# Patient Record
Sex: Female | Born: 1990 | Race: White | Hispanic: No | Smoking: Never smoker
Health system: Southern US, Community
[De-identification: ages and names within clinical notes are randomized; demographics above are authoritative.]

---

## 2018-03-30 ENCOUNTER — Encounter: Payer: Self-pay | Admitting: Emergency Medicine

## 2018-03-30 DIAGNOSIS — F329 Major depressive disorder, single episode, unspecified: Secondary | ICD-10-CM | POA: Insufficient documentation

## 2018-03-30 DIAGNOSIS — F988 Other specified behavioral and emotional disorders with onset usually occurring in childhood and adolescence: Secondary | ICD-10-CM | POA: Insufficient documentation

## 2018-03-30 DIAGNOSIS — F419 Anxiety disorder, unspecified: Secondary | ICD-10-CM | POA: Insufficient documentation

## 2018-04-12 ENCOUNTER — Other Ambulatory Visit: Payer: Self-pay | Admitting: Physician Assistant

## 2018-04-12 MED ORDER — AMPHETAMINE-DEXTROAMPHETAMINE 30 MG PO TABS: 30.0000 mg | ORAL_TABLET | Freq: Two times a day (BID) | ORAL | 0 refills | Status: DC

## 2018-04-18 ENCOUNTER — Encounter: Payer: Self-pay | Admitting: Physician Assistant

## 2018-04-18 ENCOUNTER — Ambulatory Visit (INDEPENDENT_AMBULATORY_CARE_PROVIDER_SITE_OTHER): Payer: 59 | Admitting: Physician Assistant

## 2018-04-18 VITALS — BP 113/70 | HR 102

## 2018-04-18 DIAGNOSIS — F331 Major depressive disorder, recurrent, moderate: Secondary | ICD-10-CM

## 2018-04-18 DIAGNOSIS — F900 Attention-deficit hyperactivity disorder, predominantly inattentive type: Secondary | ICD-10-CM | POA: Diagnosis not present

## 2018-04-18 DIAGNOSIS — Z630 Problems in relationship with spouse or partner: Secondary | ICD-10-CM

## 2018-04-18 MED ORDER — VORTIOXETINE HBR 10 MG PO TABS: 10.0000 mg | ORAL_TABLET | Freq: Every day | ORAL | 1 refills | Status: DC

## 2018-04-18 NOTE — Progress Notes (Addendum)
[facility] Check  Patient ID: Olivia White,  MRN: [id number]  PCP: Patient, No Pcp Per  Date of Evaluation: 04/18/2018 Time spent:15 minutes  Chief Complaint:  Chief Complaint    Medication Refill      HISTORY/CURRENT STATUS: HPI Overdue for routine med check.  Went off the Cymbalta mid Nov.  Forgot them on a business trip, so was off for 3 1/2 weeks.  When she got home, wasn't sure if she should restart them or whatever.  She had no withdrawals except nausea, and felt no worse or better off them.  States she is not really sure if the medicine was helping or not.  Her life circumstances are not ideal right now.  She and husband are separated.  She has had to move back in with her parents.  She has there son at the moment, while her husband has moved back to [place] to be with his family.  Patient states her anxiety is through the roof and she does not know what she is going to do.  She asked about going back on the Emsam which is 1 of the few things that seem to help her in the past.  The other meds she is tried, particularly the SSRIs, were when she was hospitalized in the past and she is unsure if they worked or not or whether she took them long enough to be effective.  Does complain of anhedonia and decreased motivation and energy but the Adderall does help that part.  She is working and not missing days.  She sleeps pretty well.  If she could, she would stay in bed a lot.  She denies suicidal or homicidal thoughts.  Sometimes she does feel like she wishes she was not here, "I would never do anything to hurt myself because I need to be here for my son.  He is too important to me to do something selfish like that."  Her focus and concentration are good at the moment.  Reports generalized feeling of anxiety.  "I know it is just what is going on in my life but staying anxious."  Rare panic attacks.  Individual Medical History/ Review of Systems: Changes? :No    Past medications for  mental health diagnoses include: Latuda, Emsam, Vraylar, Adderall, melatonin, Risperdal which she hated for some reason remember, Klonopin, Xanax, Prozac, Zoloft, Lexapro, Celexa, Abilify, Zyprexa, Seroquel, lithium, Lamictal, Tegretol, Effexor, Wellbutrin, Cymbalta,  Allergies: Patient has no known allergies.  Current Medications:  Current Outpatient Medications:  .  amphetamine-dextroamphetamine (ADDERALL) 30 MG tablet, Take 30 mg by mouth 2 (two) times daily., Disp: , Rfl:  .  amphetamine-dextroamphetamine (ADDERALL) 30 MG tablet, Take 1 tablet by mouth 2 (two) times daily., Disp: 60 tablet, Rfl: 0 .  vortioxetine HBr (TRINTELLIX) 10 MG TABS tablet, Take 1 tablet (10 mg total) by mouth daily., Disp: 30 tablet, Rfl: 1 Medication Side Effects: none  Family Medical/ Social History: Changes? Yes She and husband are still having lots of problems. She's living with her parents and her son   [place]:  Blood pressure 113/70, pulse (!) 102.There is no height or weight on file to calculate BMI.  General Appearance: Casual and Well Groomed  Eye Contact:  Good  Speech:  Clear and Coherent  Volume:  Normal  Mood:  Euthymic  Affect:  Appropriate  Thought Process:  Goal Directed  Orientation:  Full (Time, Place, and Person)  Thought Content: Logical   Suicidal Thoughts:  No  Homicidal  Thoughts:  No  Memory:  WNL  Judgement:  Good  Insight:  Good  Psychomotor Activity:  Normal  Concentration:  Concentration: Good and Attention Span: Good  Recall:  Good  Fund of Knowledge: Good  Language: Good  Assets:  Desire for Improvement  ADL's:  Intact  Cognition: WNL  Prognosis:  Good    DIAGNOSES:    ICD-10-CM   1. Attention deficit hyperactivity disorder (ADHD), predominantly inattentive type F90.0   2. Major depressive disorder, recurrent episode, moderate (HCC) F33.1   3. Marital stress Z63.0     Receiving Psychotherapy: No    RECOMMENDATIONS: We discussed the Emsam.  She  really should not take that with the Adderall.  I can discuss with Dr. Clovis Pu however if that is her only choice. Recommend either Trintellix or Viibryd.  We decided on Trintellix and I gave her a sample of 5 mg #7 1 p.o. daily and then Trintellix 10 mg #7 1 daily.  Then prescription for 10 mg for 30 days.  Benefits, risks, side effects were discussed and she accepts. Continue Adderall as above. Return in 4 weeks or sooner as needed.  Addendum: Her insurance be changing within the next few months.  In fact, her husband might drop her altogether and she may not be able to get insurance with her job for 2 months.  I told her that if that does happen I will make sure she has her prescriptions for that time and then will see her back once her insurance becomes effective.  Donnal Moat, PA-C

## 2018-04-19 ENCOUNTER — Encounter (HOSPITAL_COMMUNITY): Payer: Self-pay

## 2018-04-19 ENCOUNTER — Other Ambulatory Visit: Payer: Self-pay

## 2018-04-19 ENCOUNTER — Emergency Department (HOSPITAL_COMMUNITY)
Admission: EM | Admit: 2018-04-19 | Discharge: 2018-04-19 | Disposition: A | Discharge status: Home / Self Care | Payer: 59 | Attending: Emergency Medicine | Admitting: Emergency Medicine

## 2018-04-19 ENCOUNTER — Emergency Department (HOSPITAL_COMMUNITY): Payer: 59

## 2018-04-19 DIAGNOSIS — N200 Calculus of kidney: Secondary | ICD-10-CM

## 2018-04-19 DIAGNOSIS — K769 Liver disease, unspecified: Secondary | ICD-10-CM

## 2018-04-19 DIAGNOSIS — Z79899 Other long term (current) drug therapy: Secondary | ICD-10-CM | POA: Diagnosis not present

## 2018-04-19 DIAGNOSIS — R3129 Other microscopic hematuria: Secondary | ICD-10-CM | POA: Insufficient documentation

## 2018-04-19 DIAGNOSIS — R1030 Lower abdominal pain, unspecified: Secondary | ICD-10-CM

## 2018-04-19 DIAGNOSIS — R112 Nausea with vomiting, unspecified: Secondary | ICD-10-CM | POA: Insufficient documentation

## 2018-04-19 DIAGNOSIS — D376 Neoplasm of uncertain behavior of liver, gallbladder and bile ducts: Secondary | ICD-10-CM | POA: Diagnosis not present

## 2018-04-19 DIAGNOSIS — R197 Diarrhea, unspecified: Secondary | ICD-10-CM | POA: Diagnosis not present

## 2018-04-19 LAB — COMPREHENSIVE METABOLIC PANEL
ALT: 12 U/L (ref 0–44)
AST: 18 U/L (ref 15–41)
Albumin: 4.1 g/dL (ref 3.5–5.0)
Alkaline Phosphatase: 43 U/L (ref 38–126)
Anion gap: 9 (ref 5–15)
BUN: 11 mg/dL (ref 6–20)
CO2: 23 mmol/L (ref 22–32)
Calcium: 9.5 mg/dL (ref 8.9–10.3)
Chloride: 107 mmol/L (ref 98–111)
Creatinine, Ser: 1.16 mg/dL — ABNORMAL HIGH (ref 0.44–1.00)
GFR calc Af Amer: >60 mL/min (ref >60)
GFR calc non Af Amer: >60 mL/min (ref >60)
Glucose, Bld: 88 mg/dL (ref 70–99)
Potassium: 3.5 mmol/L (ref 3.5–5.1)
Sodium: 139 mmol/L (ref 135–145)
Total Bilirubin: 1 mg/dL (ref 0.3–1.2)
Total Protein: 7 g/dL (ref 6.5–8.1)

## 2018-04-19 LAB — CBC WITH DIFFERENTIAL/PLATELET
Abs Immature Granulocytes: 0.01 10*3/uL (ref 0.00–0.07)
Basophils Absolute: 0 10*3/uL (ref 0.0–0.1)
Basophils Relative: 0 %
Eosinophils Absolute: 0 10*3/uL (ref 0.0–0.5)
Eosinophils Relative: 0 %
HCT: 43.5 % (ref 36.0–46.0)
Hemoglobin: 14.8 g/dL (ref 12.0–15.0)
Immature Granulocytes: 0 %
Lymphocytes Relative: 11 %
Lymphs Abs: 0.7 10*3/uL (ref 0.7–4.0)
MCH: 31 pg (ref 26.0–34.0)
MCHC: 34 g/dL (ref 30.0–36.0)
MCV: 91.2 fL (ref 80.0–100.0)
Monocytes Absolute: 0.2 10*3/uL (ref 0.1–1.0)
Monocytes Relative: 3 %
Neutro Abs: 5.6 10*3/uL (ref 1.7–7.7)
Neutrophils Relative %: 86 %
Platelets: 343 10*3/uL (ref 150–400)
RBC: 4.77 MIL/uL (ref 3.87–5.11)
RDW: 13 % (ref 11.5–15.5)
WBC: 6.5 10*3/uL (ref 4.0–10.5)
nRBC: 0 % (ref 0.0–0.2)

## 2018-04-19 LAB — URINALYSIS, ROUTINE W REFLEX MICROSCOPIC
Bilirubin Urine: NEGATIVE
Glucose, UA: NEGATIVE mg/dL
Ketones, ur: 20 mg/dL — AB
Leukocytes, UA: NEGATIVE
Nitrite: NEGATIVE
Protein, ur: 100 mg/dL — AB
RBC / HPF: >50 RBC/hpf — ABNORMAL HIGH (ref 0–5)
Specific Gravity, Urine: 1.028 (ref 1.005–1.030)
pH: 6 (ref 5.0–8.0)

## 2018-04-19 LAB — WET PREP, GENITAL
Clue Cells Wet Prep HPF POC: NONE SEEN
Sperm: NONE SEEN
Trich, Wet Prep: NONE SEEN
Yeast Wet Prep HPF POC: NONE SEEN

## 2018-04-19 LAB — I-STAT BETA HCG BLOOD, ED (MC, WL, AP ONLY): I-stat hCG, quantitative: <5 m[IU]/mL (ref <5)

## 2018-04-19 LAB — LIPASE, BLOOD: Lipase: 25 U/L (ref 11–51)

## 2018-04-19 MED ORDER — MORPHINE SULFATE (PF) 4 MG/ML IV SOLN
4.0000 mg | Freq: Once | INTRAVENOUS | Status: AC
  Administered 2018-04-19: 4 mg via INTRAVENOUS
  Filled 2018-04-19: qty 1

## 2018-04-19 MED ORDER — ONDANSETRON HCL 4 MG/2ML IJ SOLN
4.0000 mg | Freq: Once | INTRAMUSCULAR | Status: AC
  Administered 2018-04-19: 4 mg via INTRAVENOUS
  Filled 2018-04-19: qty 2

## 2018-04-19 MED ORDER — IOHEXOL 300 MG/ML  SOLN
100.0000 mL | Freq: Once | INTRAMUSCULAR | Status: AC | PRN
  Administered 2018-04-19: 100 mL via INTRAVENOUS

## 2018-04-19 MED ORDER — ONDANSETRON 4 MG PO TBDP: 4.0000 mg | ORAL_TABLET | Freq: Three times a day (TID) | ORAL | 0 refills | Status: DC | PRN

## 2018-04-19 MED ORDER — KETOROLAC TROMETHAMINE 30 MG/ML IJ SOLN
30.0000 mg | Freq: Once | INTRAMUSCULAR | Status: AC
  Administered 2018-04-19: 30 mg via INTRAVENOUS
  Filled 2018-04-19: qty 1

## 2018-04-19 MED ORDER — HYDROCODONE-ACETAMINOPHEN 5-325 MG PO TABS: 1.0000 | ORAL_TABLET | Freq: Four times a day (QID) | ORAL | 0 refills | Status: DC | PRN

## 2018-04-19 MED ORDER — SODIUM CHLORIDE 0.9 % IV BOLUS
1000.0000 mL | Freq: Once | INTRAVENOUS | Status: AC
  Administered 2018-04-19: 1000 mL via INTRAVENOUS

## 2018-04-19 MED ORDER — ONDANSETRON 4 MG PO TBDP
4.0000 mg | ORAL_TABLET | Freq: Once | ORAL | Status: AC | PRN
  Administered 2018-04-19: 4 mg via ORAL
  Filled 2018-04-19: qty 1

## 2018-04-19 NOTE — ED Notes (Signed)
Discharge instructions and prescriptions discussed with Pt. Pt verbalized understanding. Pt stable and ambulatory.   

## 2018-04-19 NOTE — ED Provider Notes (Signed)
[facility] EMERGENCY DEPARTMENT Provider Note   CSN: [id number] Arrival date & time: 04/19/18  1522     History   Chief Complaint Chief Complaint  Patient presents with  . Abdominal Pain    HPI Olivia White is a 28 y.o. female with history of anxiety and major depression and ADD presenting for evaluation of acute onset, progressively worsening lower abdominal pain for 4 days.  She reports severe pain beginning today with multiple episodes of red emesis.  Also notes loose stools with some bright red blood in the commode though she reports this is not unusual for her.  She ended her menses 4 days ago but has noted a red vaginal discharge.  Denies urinary symptoms, chest pain, shortness of breath.  Endorse subjective fevers and chills earlier today.  Has not tried anything for her symptoms.  No aggravating or relieving factors noted.  Sent from urgent care for further evaluation.  She reports that she was last sexually active 5 months ago but did use protection.  The history is provided by the patient.    History reviewed. No pertinent past medical history.  Patient Active Problem List   Diagnosis Date Noted  . MDD (major depressive disorder) 03/30/2018  . ADD (attention deficit disorder) 03/30/2018  . Anxiety 03/30/2018    History reviewed. No pertinent surgical history.   OB History   No obstetric history on file.      Home Medications    Prior to Admission medications   Medication Sig Start Date End Date Taking? Authorizing Provider  amphetamine-dextroamphetamine (ADDERALL) 30 MG tablet Take 1 tablet by mouth 2 (two) times daily. 04/12/18  Yes Hurst, Dorothea Glassman, PA-C  vortioxetine HBr (TRINTELLIX) 10 MG TABS tablet Take 1 tablet (10 mg total) by mouth daily. 04/18/18  Yes Hurst, Dorothea Glassman, PA-C  HYDROcodone-acetaminophen (NORCO/VICODIN) 5-325 MG tablet Take 1 tablet by mouth every 6 (six) hours as needed for severe pain. 04/19/18   Merlin Ege A, PA-C    ondansetron (ZOFRAN ODT) 4 MG disintegrating tablet Take 1 tablet (4 mg total) by mouth every 8 (eight) hours as needed for nausea or vomiting. 04/19/18   Renita Papa, PA-C    Family History History reviewed. No pertinent family history.  Social History Social History   Tobacco Use  . Smoking status: Never Smoker  . Smokeless tobacco: Never Used  Substance Use Topics  . Alcohol use: Not Currently  . Drug use: Not Currently     Allergies   Patient has no known allergies.   Review of Systems Review of Systems  Constitutional: Positive for chills and fever.  Gastrointestinal: Positive for abdominal pain, diarrhea, nausea and vomiting.  Genitourinary: Positive for vaginal discharge. Negative for dysuria, frequency, hematuria and urgency.  All other systems reviewed and are negative.    Physical Exam Updated Vital Signs BP 105/63   Pulse 98   Temp 97.7 F (36.5 C) (Oral)   Resp 18   Ht 5\' 6"  (1.676 m)   Wt 58.1 kg   LMP 04/11/2018   SpO2 100%   BMI 20.66 kg/m   Physical Exam Vitals signs and nursing note reviewed. Exam conducted with a chaperone present.  Constitutional:      General: She is not in acute distress.    Appearance: She is well-developed.     Comments: Appears uncomfortable  HENT:     Head: Normocephalic and atraumatic.  Eyes:     General:  Right eye: No discharge.        Left eye: No discharge.     Conjunctiva/sclera: Conjunctivae normal.  Neck:     Vascular: No JVD.     Trachea: No tracheal deviation.  Cardiovascular:     Rate and Rhythm: Normal rate and regular rhythm.  Pulmonary:     Effort: Pulmonary effort is normal.     Breath sounds: Normal breath sounds.  Abdominal:     General: Abdomen is flat. Bowel sounds are decreased. There is no distension.     Palpations: Abdomen is soft.     Tenderness: There is abdominal tenderness in the right lower quadrant and suprapubic area. There is guarding and rebound. Positive signs  include Rovsing's sign, McBurney's sign, psoas sign and obturator sign. Negative signs include Murphy's sign.  Genitourinary:    Labia:        Right: No rash, tenderness or lesion.        Left: No rash, tenderness or lesion.      Cervix: No cervical motion tenderness or friability.     Adnexa:        Right: No tenderness or fullness.         Left: No tenderness or fullness.       Comments: Examination performed in the presence of a chaperone.  Moderate amount of brownish-red discharge in the vaginal vault Skin:    General: Skin is warm and dry.     Findings: No erythema.  Neurological:     Mental Status: She is alert.  Psychiatric:        Behavior: Behavior normal.      ED Treatments / Results  Labs (all labs ordered are listed, but only abnormal results are displayed) Labs Reviewed  WET PREP, GENITAL - Abnormal; Notable for the following components:      Result Value   WBC, Wet Prep HPF POC FEW (*)    All other components within normal limits  COMPREHENSIVE METABOLIC PANEL - Abnormal; Notable for the following components:   Creatinine, Ser 1.16 (*)    All other components within normal limits  URINALYSIS, ROUTINE W REFLEX MICROSCOPIC - Abnormal; Notable for the following components:   APPearance HAZY (*)    Hgb urine dipstick MODERATE (*)    Ketones, ur 20 (*)    Protein, ur 100 (*)    RBC / HPF >50 (*)    Bacteria, UA MANY (*)    All other components within normal limits  URINE CULTURE  CBC WITH DIFFERENTIAL/PLATELET  LIPASE, BLOOD  RPR  HIV ANTIBODY (ROUTINE TESTING W REFLEX)  I-STAT BETA HCG BLOOD, ED ([facility], [facility], [facility] ONLY)  GC/CHLAMYDIA PROBE AMP ([place]) NOT AT [facility]    EKG None  Radiology Ct Abdomen Pelvis W Contrast  Result Date: 04/19/2018 CLINICAL DATA:  Intermittent bilateral lower abdominal pain for 4-5 days, severe and constant today. EXAM: CT ABDOMEN AND PELVIS WITH CONTRAST TECHNIQUE: Multidetector CT imaging of the abdomen and pelvis was performed  using the standard protocol following bolus administration of intravenous contrast. CONTRAST:  158mL OMNIPAQUE IOHEXOL 300 MG/ML  SOLN COMPARISON:  None. FINDINGS: Lower chest: Clear lung bases. Hepatobiliary: 1.6 x 1.4 cm hyperenhancing lesion in hepatic segment VI. Mildly distended gallbladder without stones, acute inflammation, or biliary dilatation identified. Pancreas: Unremarkable. Spleen: Unremarkable. Adrenals/Urinary Tract: Unremarkable adrenal glands. Multiple small renal calculi bilaterally with the largest measuring 4 mm in the right lower pole. No ureteral calculi or hydroureteronephrosis. Unremarkable bladder. Stomach/Bowel: The  stomach is unremarkable. There is no evidence of bowel obstruction or inflammation. The appendix is unremarkable. Vascular/Lymphatic: Normal caliber of the abdominal aorta. Dilated ovarian veins measuring 10 mm in diameter on the right and 12 mm on the left with dilated parauterine veins measuring up to 8 mm in diameter, slightly greater on the left than the right. No enlarged lymph nodes. Reproductive: Grossly unremarkable uterus and ovaries. Other: No ascites or pneumoperitoneum. Small fat containing umbilical hernia. Musculoskeletal: No acute osseous abnormality or suspicious osseous lesion. IMPRESSION: 1. No acute abnormality identified in the abdomen or pelvis. 2. Bilateral nonobstructing nephrolithiasis. 3. 1.6 cm hyperenhancing liver lesion. Nonemergent abdominal MRI without and with contrast is recommended for further evaluation. 4. Dilated ovarian and parauterine veins which can be seen with pelvic congestion syndrome. Electronically Signed   By: Logan Bores M.D.   On: 04/19/2018 18:19    Procedures Procedures (including critical care time)  Medications Ordered in ED Medications  ondansetron (ZOFRAN-ODT) disintegrating tablet 4 mg (4 mg Oral Given 04/19/18 1606)  morphine 4 MG/ML injection 4 mg (4 mg Intravenous Given 04/19/18 1641)  ketorolac (TORADOL) 30 MG/ML  injection 30 mg (30 mg Intravenous Given 04/19/18 1729)  ondansetron (ZOFRAN) injection 4 mg (4 mg Intravenous Given 04/19/18 1728)  sodium chloride 0.9 % bolus 1,000 mL (0 mLs Intravenous Stopped 04/19/18 2016)  iohexol (OMNIPAQUE) 300 MG/ML solution 100 mL (100 mLs Intravenous Contrast Given 04/19/18 1750)     Initial Impression / Assessment and Plan / ED Course  I have reviewed the triage vital signs and the nursing notes.  Pertinent labs & imaging results that were available during my care of the patient were reviewed by me and considered in my medical decision making (see chart for details).     Patient presenting for evaluation of abdominal pain worsening over the last 4 days.  She is afebrile, initially tachycardic with resolution on reevaluation.  She appears quite uncomfortable.  We will give pain medicine, nausea medicine, IV fluids, obtain lab work and CT scan and reassess.  Lab work reviewed by me shows no leukocytosis, no anemia, no metabolic derangements.  Creatinine is mildly elevated but BUN is within normal limits so this may be chronic.  LFTs, lipase within normal limits.  UA suggests nephrolithiasis versus UTI with >50 RBCs, many bacteria.  However, this could be contaminant from the vaginal bleeding patient was complaining of.  On exam she does have scant amount of brownish-red vaginal discharge.  No evidence for PID in the absence of cervical motion tenderness or adnexal tenderness.  STI cultures obtained including GC chlamydia, HIV, syphilis.  Wet prep shows few WBCs but no evidence of BV, trichomoniasis, or candidiasis.  CT scan of the abdomen and pelvis shows bilateral nonobstructing nephrolithiasis, 1.6 cm hyperenhancing liver lesion, and evidence of possible pelvic congestion syndrome.  I discussed the patient's findings with her and on reassessment she reports she is feeling much better.  She is tolerating p.o. fluids without difficulty.  Serial abdominal examinations remain  benign.  No evidence of acute surgical abdominal pathology including obstruction, perforation, appendicitis, cholecystitis, or dissection.  I suspect she may have a nonobstructing punctate stone that was not picked up on by CT scan with contrast which could explain her pain and hematuria.  Alternatively, her pelvic congestion syndrome could be causing her symptoms.  Will discharge with small amount of hydrocodone for severe breakthrough pain, [place] controlled substance registry was queried and no inconsistencies were found.  We will  also discharge with Zofran.  Recommend follow-up with PCP or urology for reevaluation of symptoms.  Discussed strict ED return precautions. Pt verbalized understanding of and agreement with plan and is safe for discharge home at this time.  Discussed with Dr. Laverta Baltimore who agrees with assessment and plan at this time.  Final Clinical Impressions(s) / ED Diagnoses   Final diagnoses:  Lower abdominal pain  Nausea vomiting and diarrhea  Other microscopic hematuria  Nephrolithiasis  Liver lesion    ED Discharge Orders         Ordered    HYDROcodone-acetaminophen (NORCO/VICODIN) 5-325 MG tablet  Every 6 hours PRN     04/19/18 1954    ondansetron (ZOFRAN ODT) 4 MG disintegrating tablet  Every 8 hours PRN     04/19/18 1954           Renita Papa, PA-C 04/19/18 2311    Margette Fast, MD 04/20/18 [phone]

## 2018-04-19 NOTE — ED Triage Notes (Signed)
Pt from Urgent Care w/ a c/o bilateral lower abd pain that has been intermittent for the past 4-5 days, but today has been severe and constant. She describes the pain as labor like pain. Nausea for a couple of days, and the vomiting began today. She has vomited many times and states that it was red. She has noted a red vaginal discharge and ended her menses 4 days ago. Urine is reported to be "dark yellow to green." No odors noted.   101/59 HR 101 T 97.7 99% RA

## 2018-04-19 NOTE — Discharge Instructions (Signed)
1. Medications: Alternate 600 mg of ibuprofen and [phone] mg of Tylenol every 3 hours as needed for pain. Do not exceed 4000 mg of Tylenol daily.  Take ibuprofen with food to avoid upset stomach.  You can take hydrocodone as needed for severe pain but do not drive, drink alcohol, or operate heavy machinery while taking this medicine as it may make you drowsy.  Be aware this medicine also contains Tylenol.  You can also cut these tablets in half if they are especially strong.  Take Zofran as needed for nausea.  Wait around 20 minutes before eating or drinking after taking this medication. 2. Treatment: rest, drink plenty of fluids, advance diet slowly.  Start with water and broth then advance to bland foods that will not upset your stomach such as crackers, mashed potatoes, and peanut butter. 3. Follow Up: Please followup with your primary doctor in 3 days for discussion of your diagnoses and further evaluation after today's visit; your CT scan showed some abnormal findings including kidney stones, a liver lesion, and the possibility of pelvic congestion syndrome which could potentially be causing your pain.  If you do not have a primary care doctor use the resource guide provided to find one; Please return to the ER for persistent vomiting, high fevers or worsening symptoms

## 2018-04-19 NOTE — ED Notes (Signed)
Pt became nauseous and vomited post morphine admin.

## 2018-04-20 LAB — GC/CHLAMYDIA PROBE AMP (~~LOC~~) NOT AT ARMC
Chlamydia: NEGATIVE
Neisseria Gonorrhea: NEGATIVE

## 2018-04-20 LAB — HIV ANTIBODY (ROUTINE TESTING W REFLEX): HIV Screen 4th Generation wRfx: NONREACTIVE

## 2018-04-20 LAB — URINE CULTURE: Culture: NO GROWTH

## 2018-04-20 LAB — RPR: RPR Ser Ql: NONREACTIVE

## 2018-04-28 ENCOUNTER — Telehealth: Payer: Self-pay

## 2018-04-28 NOTE — Telephone Encounter (Signed)
Prior approval received from [facility] effective 04/28/2018-04/28/2020  PA # [id number]

## 2018-05-11 ENCOUNTER — Telehealth: Payer: Self-pay | Admitting: Physician Assistant

## 2018-05-11 ENCOUNTER — Other Ambulatory Visit: Payer: Self-pay | Admitting: Physician Assistant

## 2018-05-11 MED ORDER — AMPHETAMINE-DEXTROAMPHETAMINE 30 MG PO TABS: 30.0000 mg | ORAL_TABLET | Freq: Two times a day (BID) | ORAL | 0 refills | Status: DC

## 2018-05-11 NOTE — Telephone Encounter (Signed)
Patient requesting refill of Adderall until her appointment on Monday. Please fill at the [facility] on [address].Thanks

## 2018-05-11 NOTE — Telephone Encounter (Signed)
Patient called to check on refill again.  Said she did p/u 12/31 so today is 30 days.  She is out and concerned about getting it filled.

## 2018-05-11 NOTE — Telephone Encounter (Signed)
Last rx 12/31  See msg

## 2018-05-16 ENCOUNTER — Ambulatory Visit: Payer: 59 | Admitting: Physician Assistant

## 2018-05-16 ENCOUNTER — Telehealth: Payer: Self-pay | Admitting: Physician Assistant

## 2018-05-16 NOTE — Telephone Encounter (Signed)
Per conversation with Helene Kelp. Patient would like to discuss waving the fee for not showing due to child being sick. Please call.

## 2018-05-16 NOTE — Telephone Encounter (Signed)
Please review message

## 2018-05-16 NOTE — Telephone Encounter (Signed)
Since her child is sick, no late fee.

## 2018-06-20 ENCOUNTER — Ambulatory Visit: Payer: 59 | Admitting: Physician Assistant

## 2018-07-06 ENCOUNTER — Other Ambulatory Visit: Payer: Self-pay | Admitting: Physician Assistant

## 2018-07-06 ENCOUNTER — Telehealth: Payer: Self-pay | Admitting: Physician Assistant

## 2018-07-06 NOTE — Telephone Encounter (Signed)
Needs refill adderall.  Been out for 2 days.  Has appt. 07/08/18 but also has work and would like to have her medication.  Will you please go ahead and send in a refill.  [place]

## 2018-07-08 ENCOUNTER — Ambulatory Visit (INDEPENDENT_AMBULATORY_CARE_PROVIDER_SITE_OTHER): Payer: 59 | Admitting: Physician Assistant

## 2018-07-08 ENCOUNTER — Encounter: Payer: Self-pay | Admitting: Physician Assistant

## 2018-07-08 DIAGNOSIS — F900 Attention-deficit hyperactivity disorder, predominantly inattentive type: Secondary | ICD-10-CM

## 2018-07-08 DIAGNOSIS — F331 Major depressive disorder, recurrent, moderate: Secondary | ICD-10-CM | POA: Diagnosis not present

## 2018-07-08 MED ORDER — DESVENLAFAXINE SUCCINATE ER 50 MG PO TB24: 50.0000 mg | ORAL_TABLET | Freq: Every day | ORAL | 1 refills | Status: DC

## 2018-07-08 NOTE — Progress Notes (Signed)
[facility]  Patient ID: Olivia White,  MRN: [id number]  PCP: Olivia White  Date of Evaluation: 07/08/2018 Time spent:15 minutes  Chief Complaint:  Chief Complaint    Follow-up; Depression     Virtual Visit via Telephone Note  I connected with Olivia White on 07/08/18 at  1:30 PM EDT by telephone and verified that I am speaking with the correct person using two identifiers.   I discussed the limitations, risks, security and privacy concerns of performing an evaluation and management service by telephone and the availability of in person appointments. I also discussed with the patient that there may be a patient responsible charge related to this service. The patient expressed understanding and agreed to proceed.   HISTORY/CURRENT STATUS: HPI For med f/u.   Doesn't see any improvement with the Trintellix. Also caused nausea really bad.  Has weaned off. She wants to go off Adderall if at all possible too.  She stopped it for a few weeks ago but became really depressed and had passive suicidal thoughts.  "I just felt like it would be nice if I did not have to live like this.  But I am not going to do anything.  I would never hurt myself because I do not want to hurt my son."  Since having restarted the Adderall however, she feels less depressed, more energetic and is more motivated.  She is no longer having suicidal thoughts.  She does not cry easily and she is able to enjoy things more.  She is not missing any work.  She would like to try something else for depression.  She seems to remember that the Emsam helped her in the past.    Patient denies increased energy with decreased need for sleep, no increased talkativeness, no racing thoughts, no impulsivity or risky behaviors, no increased spending, no increased libido, no grandiosity.  Denies muscle or joint pain, stiffness, or dystonia.  Denies dizziness, syncope, seizures, numbness, tingling, tremor, tics,  unsteady gait, slurred speech, confusion.   Individual Medical History/ Review of Systems: Changes? :Yes  Had a probable kidney stone since LOV.  Past medications for mental health diagnoses include: Latuda, Emsam, Vraylar, Adderall, melatonin, Risperdal which she hated for some reason remember, Klonopin, Xanax, Prozac, Zoloft, Lexapro, Celexa, Abilify, Zyprexa, Seroquel, lithium, Lamictal, Tegretol, Effexor, Wellbutrin, Cymbalta,  Allergies: Patient has no known allergies.  Current Medications:  Current Outpatient Medications:  .  amphetamine-dextroamphetamine (ADDERALL) 30 MG tablet, Take 1 tablet by mouth 2 (two) times daily., Disp: 60 tablet, Rfl: 0 .  amphetamine-dextroamphetamine (ADDERALL) 30 MG tablet, Take 1 tablet by mouth 2 (two) times daily., Disp: 60 tablet, Rfl: 0 .  amphetamine-dextroamphetamine (ADDERALL) 30 MG tablet, Take 1 tablet by mouth 2 (two) times daily., Disp: 60 tablet, Rfl: 0 .  desvenlafaxine (PRISTIQ) 50 MG 24 hr tablet, Take 1 tablet (50 mg total) by mouth daily., Disp: 30 tablet, Rfl: 1 .  HYDROcodone-acetaminophen (NORCO/VICODIN) 5-325 MG tablet, Take 1 tablet by mouth every 6 (six) hours as needed for severe pain. (Patient not taking: Reported on 07/08/2018), Disp: 6 tablet, Rfl: 0 .  ondansetron (ZOFRAN ODT) 4 MG disintegrating tablet, Take 1 tablet (4 mg total) by mouth every 8 (eight) hours as needed for nausea or vomiting. (Patient not taking: Reported on 07/08/2018), Disp: 10 tablet, Rfl: 0 Medication Side Effects: nausea with Trintellix  Family Medical/ Social History: Changes? She and her husband are separated and divorcing.   MENTAL HEALTH EXAM:  There were  no vitals taken for this visit.There is no height or weight on file to calculate BMI.  General Appearance: phone visit, unable to assess  Eye Contact:  unable to assess  Speech:  Clear and Coherent  Volume:  Normal  Mood:  Euthymic  Affect:  unable to assess  Thought Process:  Goal Directed   Orientation:  Full (Time, Place, and Person)  Thought Content: Logical   Suicidal Thoughts:  No  Homicidal Thoughts:  No  Memory:  WNL  Judgement:  Good  Insight:  Good  Psychomotor Activity:  Normal  Concentration:  Concentration: Good  Recall:  Good  Fund of Knowledge: Good  Language: Good  Assets:  Desire for Improvement  ADL's:  Intact  Cognition: WNL  Prognosis:  Good    DIAGNOSES:    ICD-10-CM   1. Major depressive disorder, recurrent episode, moderate (HCC) F33.1   2. Attention deficit hyperactivity disorder (ADHD), predominantly inattentive type F90.0     Receiving Psychotherapy: Yes Olivia White ? name   RECOMMENDATIONS: We discussed different options.  I would be fine with her going back on Emsam, however she is on Adderall and the 2 are usually not prescribed together due to possibility of blood pressure increases.  I especially do not want to prescribe that today because I am not able to get a blood pressure reading on her.  (I am doing a phone visit due to the coronavirus pandemic) Other options include Pristiq, any of the older tricyclic antidepressants or others. Start Pristiq 50 mg p.o. daily. Continue Adderall 30 mg 1/2 twice daily as needed. Continue psychotherapy. Return in 4 to 6 weeks.  Donnal Moat, PA-C   This record has been created using [employer].  Chart creation errors have been sought, but may not always have been located and corrected. Such creation errors do not reflect on the standard of medical care.

## 2018-08-05 ENCOUNTER — Other Ambulatory Visit: Payer: Self-pay | Admitting: Physician Assistant

## 2018-08-05 ENCOUNTER — Telehealth: Payer: Self-pay | Admitting: Physician Assistant

## 2018-08-05 MED ORDER — AMPHETAMINE-DEXTROAMPHETAMINE 30 MG PO TABS: 30.0000 mg | ORAL_TABLET | Freq: Two times a day (BID) | ORAL | 0 refills | Status: DC

## 2018-08-05 NOTE — Telephone Encounter (Signed)
Done

## 2018-08-05 NOTE — Telephone Encounter (Signed)
Olivia White called at 4:00pm to request refill on her Adderall.  She has appt 08/10/18 but will be out this weekend.  Please send in to [facility]

## 2018-08-10 ENCOUNTER — Ambulatory Visit (INDEPENDENT_AMBULATORY_CARE_PROVIDER_SITE_OTHER): Payer: 59 | Admitting: Physician Assistant

## 2018-08-10 ENCOUNTER — Other Ambulatory Visit: Payer: Self-pay

## 2018-08-10 ENCOUNTER — Encounter: Payer: Self-pay | Admitting: Physician Assistant

## 2018-08-10 DIAGNOSIS — F331 Major depressive disorder, recurrent, moderate: Secondary | ICD-10-CM

## 2018-08-10 DIAGNOSIS — F900 Attention-deficit hyperactivity disorder, predominantly inattentive type: Secondary | ICD-10-CM

## 2018-08-10 MED ORDER — DESVENLAFAXINE SUCCINATE ER 100 MG PO TB24: 100.0000 mg | ORAL_TABLET | Freq: Every day | ORAL | 1 refills | Status: DC

## 2018-08-10 NOTE — Progress Notes (Signed)
[facility]  Patient ID: Olivia White,  MRN: [id number]  PCP: Patient, No Pcp Per  Date of Evaluation: 08/10/2018 Time spent:15 minutes  Chief Complaint:  Chief Complaint    Follow-up     Virtual Visit via Telephone Note  I connected with patient by a video enabled telemedicine application or telephone, with their informed consent, and verified patient privacy and that I am speaking with the correct person using two identifiers.  I am private, in my home and the patient is in her car, in private.  I discussed the limitations, risks, security and privacy concerns of performing an evaluation and management service by telephone and the availability of in person appointments. I also discussed with the patient that there may be a patient responsible charge related to this service. The patient expressed understanding and agreed to proceed.   I discussed the assessment and treatment plan with the patient. The patient was provided an opportunity to ask questions and all were answered. The patient agreed with the plan and demonstrated an understanding of the instructions.   The patient was advised to call back or seek an in-person evaluation if the symptoms worsen or if the condition fails to improve as anticipated.  I provided 15 minutes of non-face-to-face time during this encounter.  HISTORY/CURRENT STATUS: HPI For 4-week med check.  At the last visit, we added Pristiq.  States she has not noticed any improvement at all.  She has not noticed any negative effects either though.  She has had some passive thoughts about "not wanting to be here" but is not sure if that is related to the medication or not.  That has been an issue in the past.  She denies having a plan for suicide.  States that she will not do anything to hurt herself because that would hurt her son.  She is working and not missing days.  She sleeps well.  Does not enjoy much of anything and has low energy and  motivation.  The coronavirus pandemic not really affecting her that much.  Not isolating any more than normal.  States she is doing well as far as concentration and attention span.  The Adderall is really helping a lot.  Denies muscle or joint pain, stiffness, or dystonia.  Denies dizziness, syncope, seizures, numbness, tingling, tremor, tics, unsteady gait, slurred speech, confusion.   Individual Medical History/ Review of Systems: Changes? :No    Past medications for mental health diagnoses include: Latuda, Emsam, Vraylar, Adderall, melatonin, Risperdal which she hated for some reason remember, Klonopin, Xanax, Prozac, Zoloft, Lexapro, Celexa, Abilify, Zyprexa, Seroquel, lithium, Lamictal, Tegretol, Effexor, Wellbutrin, Cymbalta,   Allergies: Patient has no known allergies.  Current Medications:  Current Outpatient Medications:  .  amphetamine-dextroamphetamine (ADDERALL) 30 MG tablet, Take 1 tablet by mouth 2 (two) times daily., Disp: 60 tablet, Rfl: 0 .  amphetamine-dextroamphetamine (ADDERALL) 30 MG tablet, Take 1 tablet by mouth 2 (two) times daily., Disp: 60 tablet, Rfl: 0 .  amphetamine-dextroamphetamine (ADDERALL) 30 MG tablet, Take 1 tablet by mouth 2 (two) times daily., Disp: 60 tablet, Rfl: 0 .  desvenlafaxine (PRISTIQ) 100 MG 24 hr tablet, Take 1 tablet (100 mg total) by mouth daily., Disp: 30 tablet, Rfl: 1 .  HYDROcodone-acetaminophen (NORCO/VICODIN) 5-325 MG tablet, Take 1 tablet by mouth every 6 (six) hours as needed for severe pain. (Patient not taking: Reported on 07/08/2018), Disp: 6 tablet, Rfl: 0 .  ondansetron (ZOFRAN ODT) 4 MG disintegrating tablet, Take 1 tablet (4 mg  total) by mouth every 8 (eight) hours as needed for nausea or vomiting. (Patient not taking: Reported on 07/08/2018), Disp: 10 tablet, Rfl: 0 Medication Side Effects: none  Family Medical/ Social History: Changes? No  MENTAL HEALTH EXAM:  There were no vitals taken for this visit.There is no height  or weight on file to calculate BMI.  General Appearance: Unable to assess  Eye Contact:  Unable to assess  Speech:  Clear and Coherent  Volume:  Normal  Mood:  Euthymic  Affect:  Unable to assess  Thought Process:  Goal Directed  Orientation:  Full (Time, Place, and Person)  Thought Content: Logical   Suicidal Thoughts:  Yes.  without intent/plan  Homicidal Thoughts:  No  Memory:  WNL  Judgement:  Good  Insight:  Good  Psychomotor Activity:  Unable to assess  Concentration:  Concentration: Good and Attention Span: Good  Recall:  Good  Fund of Knowledge: Good  Language: Good  Assets:  Desire for Improvement  ADL's:  Intact  Cognition: WNL  Prognosis:  Good    DIAGNOSES:    ICD-10-CM   1. Major depressive disorder, recurrent episode, moderate (HCC) F33.1   2. Attention deficit hyperactivity disorder (ADHD), predominantly inattentive type F90.0     Receiving Psychotherapy: Yes    RECOMMENDATIONS:  Increase Pristiq to 100 mg p.o. daily. We discussed the possibility of going back on Emsam, which she has been on in the past.  Because she is on Adderall however I would want to discuss with Dr. Clovis Pu before prescribing the 2.  Rarely they are prescribed together. Continue Adderall 30 mg twice daily. Contract for safety in place.  She will call if there are problems prior to her next visit. Return in 4 to 6 weeks.   Donnal Moat, PA-C   This record has been created using [employer].  Chart creation errors have been sought, but may not always have been located and corrected. Such creation errors do not reflect on the standard of medical care.

## 2018-08-26 DIAGNOSIS — Z0289 Encounter for other administrative examinations: Secondary | ICD-10-CM

## 2018-09-19 ENCOUNTER — Telehealth: Payer: Self-pay | Admitting: Physician Assistant

## 2018-09-19 NOTE — Telephone Encounter (Signed)
Noted on yellow sticky

## 2018-09-19 NOTE — Telephone Encounter (Signed)
Olivia White called to report that she should not be prescribed Adderall due to addiction issues.  Please note in chart.

## 2019-01-11 ENCOUNTER — Telehealth: Payer: Self-pay | Admitting: Physician Assistant

## 2019-01-11 NOTE — Telephone Encounter (Signed)
Pt called stated she is sending over some ppwrk for you to fill out for work. She is aware of the $45 fee. She stated to fill out exactly like before except change #5 to the current date you complete the forms. She states the frequency and duration of the panic attacks is the same.

## 2019-01-12 NOTE — Telephone Encounter (Signed)
Thanks Traci.

## 2019-05-03 IMAGING — CT CT ABD-PELV W/ CM
2 of 4 series · 15 of 46 positions shown, 17 images · IV contrast (Omni 300)
Comparison: None.

CLINICAL DATA: Intermittent bilateral lower abdominal pain for 4-5
days, severe and constant today.

EXAM:
CT ABDOMEN AND PELVIS WITH CONTRAST
TECHNIQUE: Multidetector CT imaging of the abdomen and pelvis was performed
using the standard protocol following bolus administration of
intravenous contrast.
CONTRAST:  100mL OMNIPAQUE IOHEXOL 300 MG/ML  SOLN

[Series 3: a/p w/ 5mm · axial · 0.69mm/px · z∈[+857,+1262]mm · 12 of 89 slices shown, 14 images]
[im 4/89  soft-tissue]
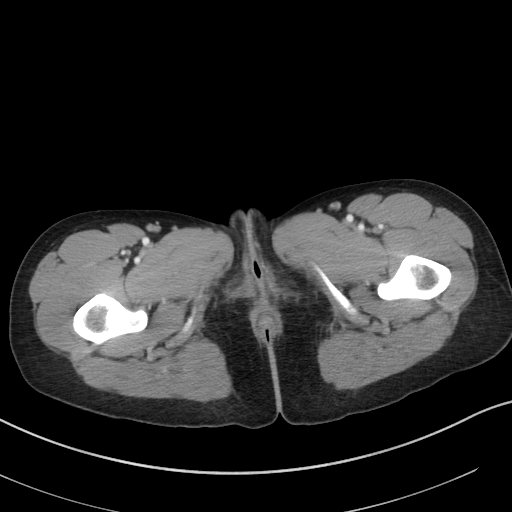
[im 4/89  bone]
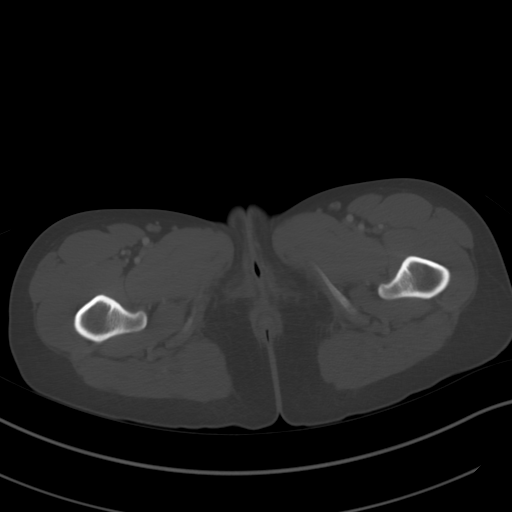
[im 12/89  soft-tissue]
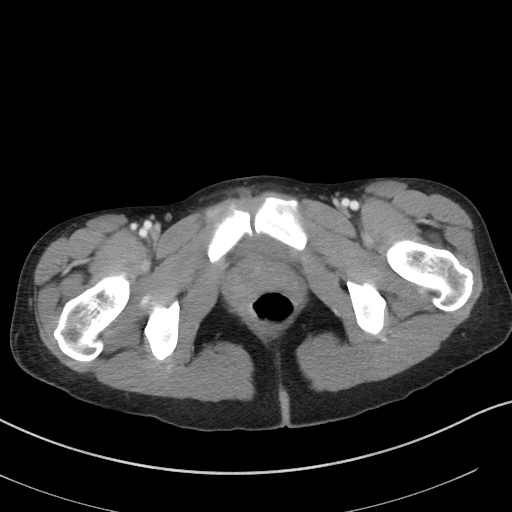
[im 19/89  soft-tissue]
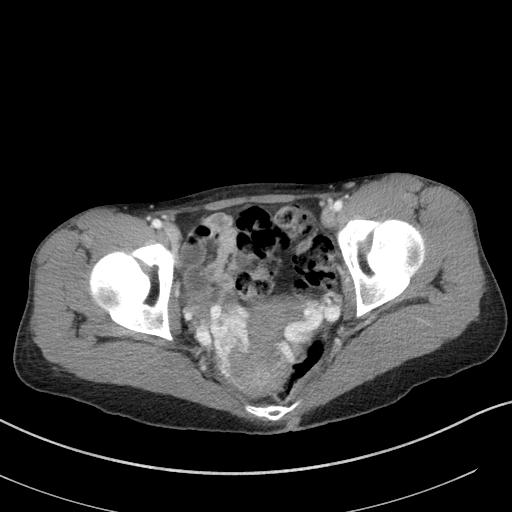
[im 26/89  soft-tissue]
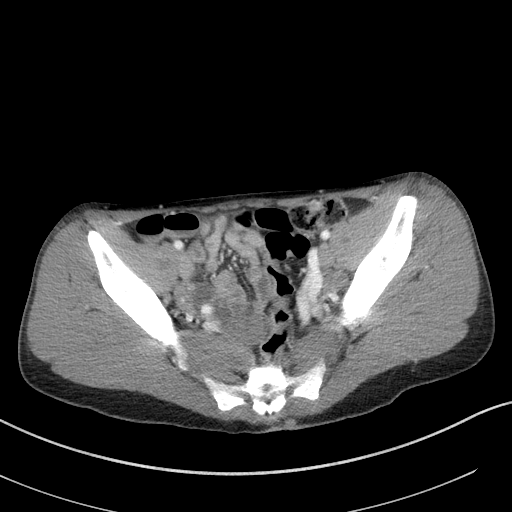
[im 34/89  soft-tissue]
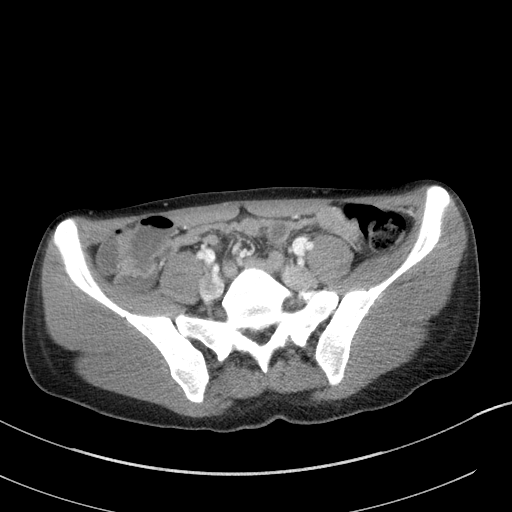
[im 41/89  soft-tissue]
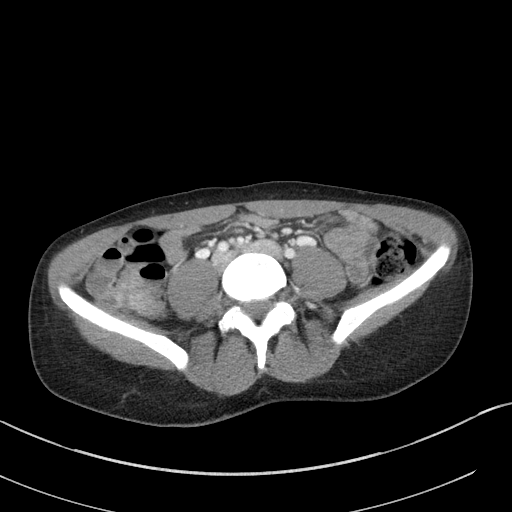
[im 48/89  soft-tissue]
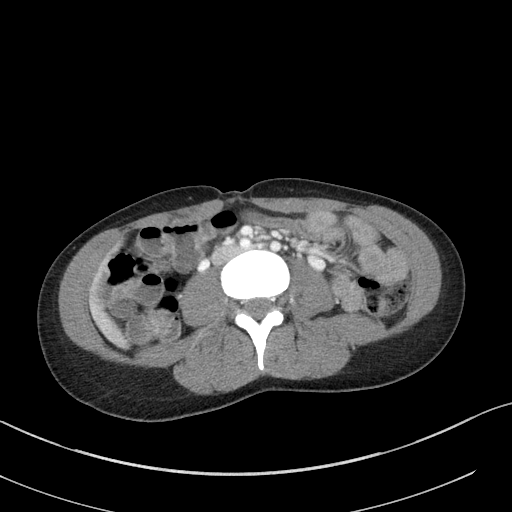
[im 56/89  soft-tissue]
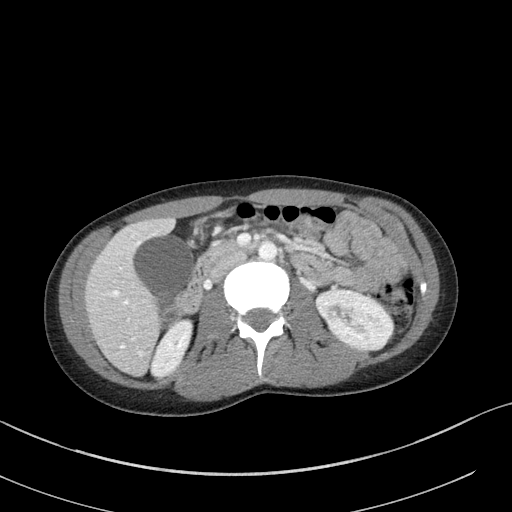
[im 63/89  soft-tissue]
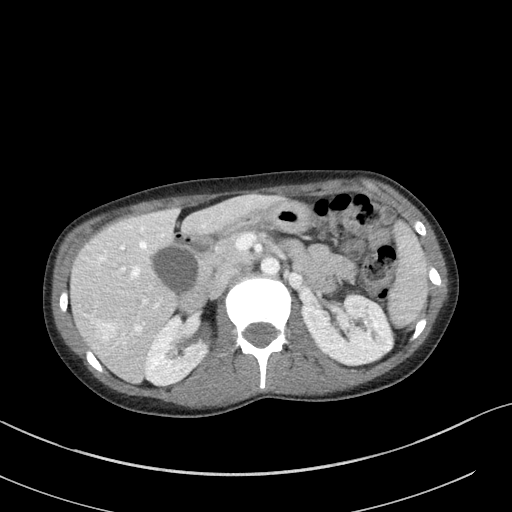
[im 63/89  bone]
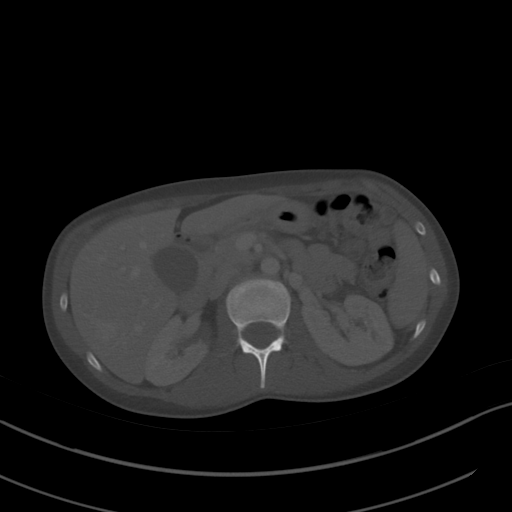
[im 70/89  soft-tissue]
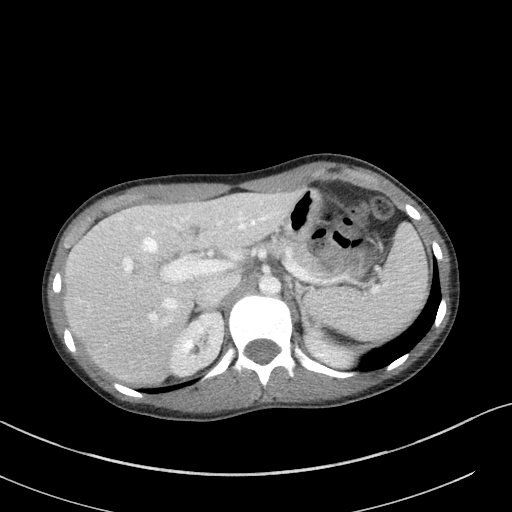
[im 78/89  soft-tissue]
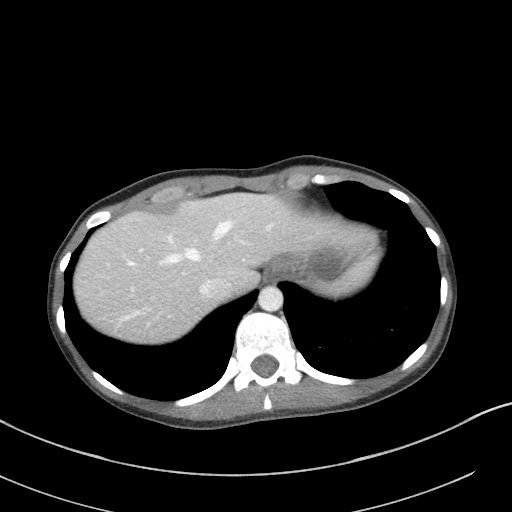
[im 85/89  soft-tissue]
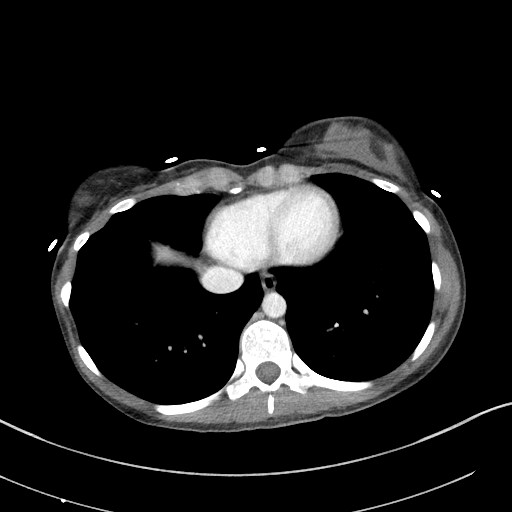

[Series 6: a/p w/ cor · coronal · 0.59mm/px · 3 of 76 slices shown]
[im 26/76  soft-tissue]
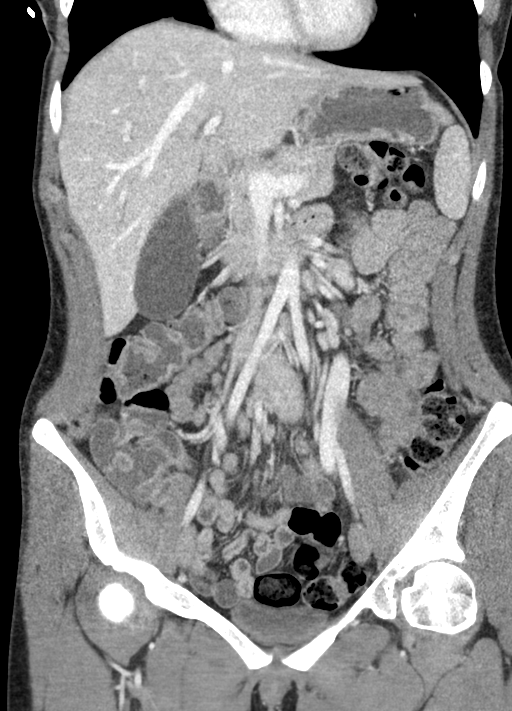
[im 34/76  soft-tissue]
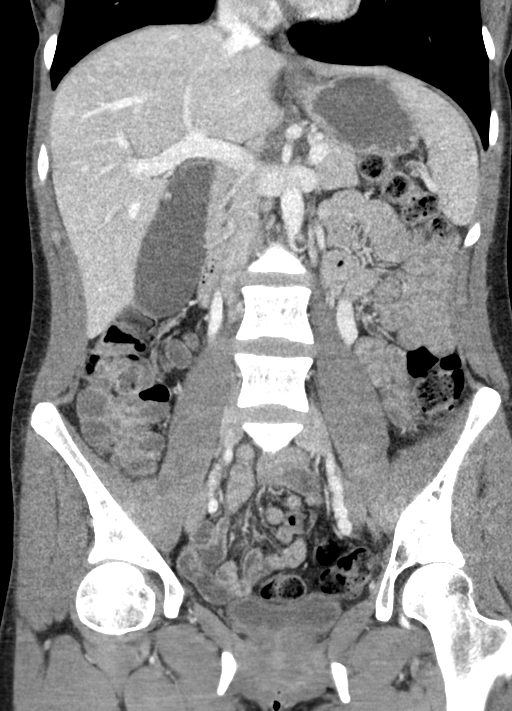
[im 42/76  soft-tissue]
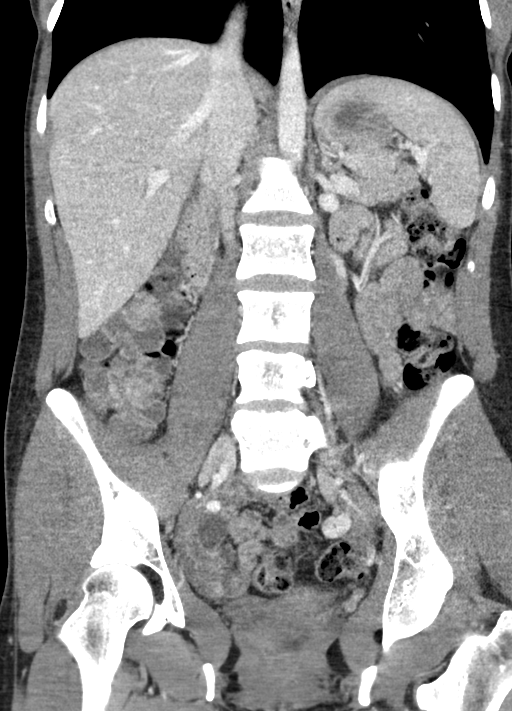

[15 of 46 positions shown; findings below may reference images not displayed]

FINDINGS: Lower chest: Clear lung bases.

Hepatobiliary: 1.6 x 1.4 cm hyperenhancing lesion in hepatic segment
VI. Mildly distended gallbladder without stones, acute inflammation,
or biliary dilatation identified.

Pancreas: Unremarkable.

Spleen: Unremarkable.

Adrenals/Urinary Tract: Unremarkable adrenal glands. Multiple small
renal calculi bilaterally with the largest measuring 4 mm in the
right lower pole. No ureteral calculi or hydroureteronephrosis.
Unremarkable bladder.

Stomach/Bowel: The stomach is unremarkable. There is no evidence of
bowel obstruction or inflammation. The appendix is unremarkable.

Vascular/Lymphatic: Normal caliber of the abdominal aorta. Dilated
ovarian veins measuring 10 mm in diameter on the right and 12 mm on
the left with dilated parauterine veins measuring up to 8 mm in
diameter, slightly greater on the left than the right. No enlarged
lymph nodes.

Reproductive: Grossly unremarkable uterus and ovaries.

Other: No ascites or pneumoperitoneum. Small fat containing
umbilical hernia.

Musculoskeletal: No acute osseous abnormality or suspicious osseous
lesion.
IMPRESSION: 1. No acute abnormality identified in the abdomen or pelvis.
2. Bilateral nonobstructing nephrolithiasis.
3. 1.6 cm hyperenhancing liver lesion. Nonemergent abdominal MRI
without and with contrast is recommended for further evaluation.
4. Dilated ovarian and parauterine veins which can be seen with
pelvic congestion syndrome.

## 2019-06-29 ENCOUNTER — Other Ambulatory Visit: Payer: Self-pay

## 2019-06-29 ENCOUNTER — Encounter: Payer: Self-pay | Admitting: Physician Assistant

## 2019-06-29 ENCOUNTER — Ambulatory Visit (INDEPENDENT_AMBULATORY_CARE_PROVIDER_SITE_OTHER): Payer: 59 | Admitting: Physician Assistant

## 2019-06-29 DIAGNOSIS — F411 Generalized anxiety disorder: Secondary | ICD-10-CM | POA: Diagnosis not present

## 2019-06-29 DIAGNOSIS — F331 Major depressive disorder, recurrent, moderate: Secondary | ICD-10-CM | POA: Diagnosis not present

## 2019-06-29 DIAGNOSIS — F900 Attention-deficit hyperactivity disorder, predominantly inattentive type: Secondary | ICD-10-CM | POA: Diagnosis not present

## 2019-06-29 MED ORDER — VIIBRYD 20 MG PO TABS: 20.0000 mg | ORAL_TABLET | Freq: Every day | ORAL | 0 refills | Status: DC

## 2019-06-29 MED ORDER — AMPHETAMINE-DEXTROAMPHET ER 10 MG PO CP24: 10.0000 mg | ORAL_CAPSULE | Freq: Every day | ORAL | 0 refills | Status: DC

## 2019-06-29 NOTE — Progress Notes (Signed)
[facility]  Patient ID: Olivia White,  MRN: [id number]  PCP: Patient, No Pcp Per  Date of Evaluation: 06/29/2019 Time spent:30 minutes  Chief Complaint:  Chief Complaint    ADD; Depression      HISTORY/CURRENT STATUS: HPI For routine med check.  Not doing well. Not on any meds at all for the past 9 months or so.  She was only on the Pristiq for about 2 months and was not able to tell any difference in her mood.  She has had treatment resistant depression off and on for years and has tried many different medications.  She has been trying to deal with things on her own but it has gotten to a point she needs help.    She is missing work more often than not.  She wants to stay in bed all the time, "which is hard with a 29-year-old."  Reports being so tired it is all she can do to get up and take care of her son, take him to preschool and then go get him.  The day she does not work, she sleeps the whole time he is gone and then all night as well.  States she is taking care of all of his needs and he is safe.  She does not sleep while he is awake in her care.  She does not enjoy anything.  She isolates.  Does not want to be around anybody or do anything except stay home.  Denies suicidal or homicidal thoughts.  Also has a lot of anxiety.  "I can deal with that for right now.  I try to use techniques that I have learned in therapy and that is helpful.  I want to focus on the depression and get better with that and then we can deal with the anxiety."  She is also having trouble focusing to get her work done.  That is another reason she does not want to go to work.  She had been on Adderall instant release in the past.  She called here about 9 months ago when she was on the Adderall, stating that she was concerned she may have a problem with it and do not give her any more.  When I asked more specifically about that, she said she was taking it most days as prescribed but some days she  would take another 1/2 pill to help her get through the day.  States it helped her have more energy and motivation as well as help in the depression.  She felt more like getting up and going out of the house and doing things.  And she was also more focused at work.  Individual Medical History/ Review of Systems: Changes? :No    Past medications for mental health diagnoses include: Latuda, Emsam, Vraylar, Adderall, melatonin, Risperdal which she hated for some reason remember, Klonopin, Xanax, Prozac, Zoloft, Lexapro, Celexa, Abilify, Zyprexa, Seroquel, lithium, Lamictal, Tegretol, Effexor, Wellbutrin, Cymbalta, Pristiq  Allergies: Patient has no known allergies.  Current Medications:  Current Outpatient Medications:  .  amphetamine-dextroamphetamine (ADDERALL XR) 10 MG 24 hr capsule, Take 1 capsule (10 mg total) by mouth daily., Disp: 30 capsule, Rfl: 0 .  buPROPion (WELLBUTRIN XL) 150 MG 24 hr tablet, Take 1 tablet (150 mg total) by mouth daily., Disp: 30 tablet, Rfl: 1 .  HYDROcodone-acetaminophen (NORCO/VICODIN) 5-325 MG tablet, Take 1 tablet by mouth every 6 (six) hours as needed for severe pain. (Patient not taking: Reported on 07/08/2018), Disp: 6 tablet,  Rfl: 0 .  ondansetron (ZOFRAN ODT) 4 MG disintegrating tablet, Take 1 tablet (4 mg total) by mouth every 8 (eight) hours as needed for nausea or vomiting. (Patient not taking: Reported on 07/08/2018), Disp: 10 tablet, Rfl: 0 Medication Side Effects: none  Family Medical/ Social History: Changes? No  MENTAL HEALTH EXAM:  There were no vitals taken for this visit.There is no height or weight on file to calculate BMI.  General Appearance: Casual, Neat and Well Groomed  Eye Contact:  Good  Speech:  Clear and Coherent and Normal Rate  Volume:  Normal  Mood:  Euthymic  Affect:  Appropriate  Thought Process:  Goal Directed and Descriptions of Associations: Intact  Orientation:  Full (Time, Place, and Person)  Thought Content: Logical    Suicidal Thoughts:  No  Homicidal Thoughts:  No  Memory:  WNL  Judgement:  Good  Insight:  Good  Psychomotor Activity:  Normal  Concentration:  Concentration: Fair and Attention Span: Fair  Recall:  Good  Fund of Knowledge: Good  Language: Good  Assets:  Desire for Improvement  ADL's:  Intact  Cognition: WNL  Prognosis:  Good    DIAGNOSES:    ICD-10-CM   1. Major depressive disorder, recurrent episode, moderate (HCC)  F33.1   2. Attention deficit hyperactivity disorder (ADHD), predominantly inattentive type  F90.0   3. Generalized anxiety disorder  F41.1     Receiving Psychotherapy: No    RECOMMENDATIONS:  PDMP was reviewed. I spent 30 minutes with her. We discussed adding Viibryd or Trintellix, but she has a Hospital doctor plan so were not sure if the co-pay card would be effective or not.  We decided to try the Viibryd and see if her insurance will pay and, if we need to prior authorization or what ever needs to be done.  I gave her a 2-week sample pack along with the co-pay card.  Benefits, risks, side effects were discussed. If the Viibryd ends up being too expensive then I would like to retry Wellbutrin.  She has taken it in the past but does not remember what happened or why she went off of it.  Benefits, risks, and side effects were discussed and she accepts.  She is never had a seizure or eating disorder.  As far as the ADD goes, different medication options were given.  Of course, the stimulants are more effective than that other drugs.  Her concerns for the Adderall last year were more of the fact that she felt better therefore she wanted to take the drug so she would feel better.  She does not describe any kind of craving or "high" that she got from it.  I would recommend that we use a long-acting stimulant.  And not use any short acting's ever unless something changes.  I would recommend Vyvanse initially but of course is too expensive and since we know how  she has responded to the Adderall, we agreed to try Adderall XR to begin with.  She understands the benefits and risks.  Start Adderall XR 10 mg, 1 p.o. every morning. Start Viibryd 10 mg for 7 days and then 20 mg starter pack was given, then a 20 mg prescription was sent in. Return in 6 weeks.  06/30/2019 Patient called back and stated the Viibryd was going to be over $200 even with the co-pay card.  I will send in a prescription for Wellbutrin. Start Wellbutrin XL 150 mg, 1 p.o. every morning. Return in 6  weeks.   Donnal Moat, PA-C

## 2019-06-30 ENCOUNTER — Telehealth: Payer: Self-pay

## 2019-06-30 ENCOUNTER — Telehealth: Payer: Self-pay | Admitting: Physician Assistant

## 2019-06-30 MED ORDER — BUPROPION HCL ER (XL) 150 MG PO TB24: 150.0000 mg | ORAL_TABLET | Freq: Every day | ORAL | 1 refills | Status: DC

## 2019-06-30 NOTE — Telephone Encounter (Signed)
Prior authorization submitted for AMPHETAMINE-DEXTROAMPHETAMINE ER 10 MG #30 through [employer] ID# [id number], effective 06/30/2019-06/29/2020. PA# [id number]    Submitted through cover my meds

## 2019-06-30 NOTE — Telephone Encounter (Signed)
I received a prior authorization on this today, I will submit that first to see if it changes.

## 2019-06-30 NOTE — Telephone Encounter (Signed)
I gave her the discount card.  She was the one I asked you and Leda Gauze about with the high deductible.  I decided to go ahead and try to see what ins will do. I also gave her samples but she's not going to take them until we know how much the rx will cost.

## 2019-06-30 NOTE — Telephone Encounter (Signed)
The pharmacy says with her insurance it's $293.00 and doesn't need a PA because it's on her formulary.

## 2019-06-30 NOTE — Telephone Encounter (Signed)
Calliegh called to report that the Viibryd is too expensive.  Will you please send in the prescription that you had as a back up.  [facility] [address]

## 2019-07-10 ENCOUNTER — Ambulatory Visit: Payer: 59 | Admitting: Physician Assistant

## 2019-07-27 ENCOUNTER — Telehealth: Payer: Self-pay | Admitting: Physician Assistant

## 2019-07-27 ENCOUNTER — Other Ambulatory Visit: Payer: Self-pay

## 2019-07-27 MED ORDER — AMPHETAMINE-DEXTROAMPHET ER 10 MG PO CP24: 10.0000 mg | ORAL_CAPSULE | Freq: Every day | ORAL | 0 refills | Status: DC

## 2019-07-27 NOTE — Telephone Encounter (Signed)
Last refill 06/30/2019, pended for Helene Kelp to submit

## 2019-07-27 NOTE — Telephone Encounter (Signed)
Patient called and said she needs a refill on her adderall. She has an appointment on 4/22. [facility] [address]

## 2019-08-03 ENCOUNTER — Other Ambulatory Visit: Payer: Self-pay

## 2019-08-03 ENCOUNTER — Encounter: Payer: Self-pay | Admitting: Physician Assistant

## 2019-08-03 ENCOUNTER — Ambulatory Visit (INDEPENDENT_AMBULATORY_CARE_PROVIDER_SITE_OTHER): Payer: 59 | Admitting: Physician Assistant

## 2019-08-03 DIAGNOSIS — F331 Major depressive disorder, recurrent, moderate: Secondary | ICD-10-CM | POA: Diagnosis not present

## 2019-08-03 DIAGNOSIS — F900 Attention-deficit hyperactivity disorder, predominantly inattentive type: Secondary | ICD-10-CM

## 2019-08-03 NOTE — Progress Notes (Signed)
[facility]  Patient ID: Olivia White,  MRN: [id number]  PCP: Patient, No Pcp Per  Date of Evaluation: 08/03/2019 Time spent:20 minutes  Chief Complaint:  Chief Complaint    ADD; Depression      HISTORY/CURRENT STATUS: HPI For routine med check.   Doing better since starting the Wellbutrin XL and Adderall XR.  She has had no temptation at all to take more of the Adderall.  The only problem is that she had 1 day of nausea and vomiting and most likely fever because she was having chills and was really cold.  During that time she did not take the Adderall or Wellbutrin for 2 days.  She was not sure if those symptoms were related to the medications or not.  She restarted them both but is breaking the Wellbutrin XL in half and taking that for now.  She has had no further nausea or vomiting or fever.  States she is better with her mood.  She is more able to enjoy things, has more energy and motivation.  She still has trouble falling asleep at night but it is no worse.  That has been going on for a long time.  Not isolating.  She is working without problems.  No suicidal or homicidal thoughts.  Is more able to concentrate.  Able to stay on task and finish in a timely manner whereas before it was much more difficult.  Denies dizziness, syncope, seizures, numbness, tingling, tremor, tics, unsteady gait, slurred speech, confusion. Denies muscle or joint pain, stiffness, or dystonia.  Individual Medical History/ Review of Systems: Changes? :Yes Had what seems like a 24-hour stomach flu since the last visit.  Past medications for mental health diagnoses include: Latuda, Emsam, Vraylar, Adderall, melatonin, Risperdal which she hated for some reason remember, Klonopin, Xanax, Prozac, Zoloft, Lexapro, Celexa, Abilify, Zyprexa, Seroquel, lithium, Lamictal, Tegretol, Effexor, Wellbutrin, Cymbalta, Pristiq  Allergies: Patient has no known allergies.  Current Medications:  Current  Outpatient Medications:  .  amphetamine-dextroamphetamine (ADDERALL XR) 10 MG 24 hr capsule, Take 1 capsule (10 mg total) by mouth daily., Disp: 30 capsule, Rfl: 0 .  buPROPion (WELLBUTRIN XL) 150 MG 24 hr tablet, Take 1 tablet (150 mg total) by mouth daily., Disp: 30 tablet, Rfl: 1 .  HYDROcodone-acetaminophen (NORCO/VICODIN) 5-325 MG tablet, Take 1 tablet by mouth every 6 (six) hours as needed for severe pain. (Patient not taking: Reported on 07/08/2018), Disp: 6 tablet, Rfl: 0 .  ondansetron (ZOFRAN ODT) 4 MG disintegrating tablet, Take 1 tablet (4 mg total) by mouth every 8 (eight) hours as needed for nausea or vomiting. (Patient not taking: Reported on 07/08/2018), Disp: 10 tablet, Rfl: 0 Medication Side Effects: none  Family Medical/ Social History: Changes? No  MENTAL HEALTH EXAM:  There were no vitals taken for this visit.There is no height or weight on file to calculate BMI.  General Appearance: Casual, Neat and Well Groomed  Eye Contact:  Good  Speech:  Clear and Coherent and Normal Rate  Volume:  Normal  Mood:  Euthymic  Affect:  Appropriate  Thought Process:  Goal Directed and Descriptions of Associations: Intact  Orientation:  Full (Time, Place, and Person)  Thought Content: Logical   Suicidal Thoughts:  No  Homicidal Thoughts:  No  Memory:  WNL  Judgement:  Good  Insight:  Good  Psychomotor Activity:  Normal  Concentration:  Concentration: Good and Attention Span: Good  Recall:  Good  Fund of Knowledge: Good  Language: Good  Assets:  Desire for Improvement  ADL's:  Intact  Cognition: WNL  Prognosis:  Good    DIAGNOSES:    ICD-10-CM   1. Major depressive disorder, recurrent episode, moderate (HCC)  F33.1   2. Attention deficit hyperactivity disorder (ADHD), predominantly inattentive type  F90.0   3. Generalized anxiety disorder  F41.1     Receiving Psychotherapy: No    RECOMMENDATIONS:  PDMP reviewed.  I spent 20 minutes with her. We discussed her symptoms  of nausea vomiting and probable fever.  There has been a stomach bug going around the area which has caused her exact symptoms.  I do not think the medications would cause her symptoms, especially since she has been tolerating them fine since she restarted them. Increase Wellbutrin XL back to 150 mg p.o. every morning. Continue Adderall XR 10 mg, 1 p.o. every morning. We discussed the possibility of changing Adderall XR to Adzenys.  This would help with cost.  At this time, she is not interested in changing since we have just restarted the Adderall XR.  She will think about it and decide if she would like to switch next time. As long as she is doing well, she can call in 1 month and let me know, I will send in prescriptions.  If she feels like we could have some improvement, she will make an appointment to come back in. Return in 3 months  Donnal Moat, [place]

## 2019-08-30 ENCOUNTER — Other Ambulatory Visit: Payer: Self-pay | Admitting: Physician Assistant

## 2019-08-31 ENCOUNTER — Other Ambulatory Visit: Payer: Self-pay

## 2019-08-31 ENCOUNTER — Telehealth: Payer: Self-pay | Admitting: Physician Assistant

## 2019-08-31 MED ORDER — AMPHETAMINE-DEXTROAMPHET ER 10 MG PO CP24: 10.0000 mg | ORAL_CAPSULE | Freq: Every day | ORAL | 0 refills | Status: DC

## 2019-08-31 NOTE — Telephone Encounter (Signed)
Patient called and said that she needs a refill on the adderall xr 10 mg [facility] [address]

## 2019-08-31 NOTE — Telephone Encounter (Signed)
Last refill 08/03/2019 pended for Helene Kelp to submit

## 2019-09-29 ENCOUNTER — Telehealth: Payer: Self-pay | Admitting: Physician Assistant

## 2019-09-29 ENCOUNTER — Other Ambulatory Visit: Payer: Self-pay

## 2019-09-29 MED ORDER — AMPHETAMINE-DEXTROAMPHET ER 10 MG PO CP24: 10.0000 mg | ORAL_CAPSULE | Freq: Every day | ORAL | 0 refills | Status: DC

## 2019-09-29 NOTE — Telephone Encounter (Signed)
Last refill 09/02/2019 pended for Olivia White to submit

## 2019-09-29 NOTE — Telephone Encounter (Signed)
Olivia White called to report that her medications are working well.  Please call in refills.  She has not schedule July appt because she waiting on her work schedule. [facility]

## 2019-10-27 ENCOUNTER — Telehealth: Payer: Self-pay | Admitting: Physician Assistant

## 2019-10-27 ENCOUNTER — Other Ambulatory Visit: Payer: Self-pay

## 2019-10-27 MED ORDER — AMPHETAMINE-DEXTROAMPHET ER 10 MG PO CP24: 10.0000 mg | ORAL_CAPSULE | Freq: Every day | ORAL | 0 refills | Status: DC

## 2019-10-27 NOTE — Telephone Encounter (Signed)
Pt would like a refill on adderall xr. [facility] [address]

## 2019-10-27 NOTE — Telephone Encounter (Signed)
Last refill 09/29/2019 Pended for Olivia White  Has upcoming apt 07/29

## 2019-11-09 ENCOUNTER — Other Ambulatory Visit: Payer: Self-pay

## 2019-11-09 ENCOUNTER — Ambulatory Visit (INDEPENDENT_AMBULATORY_CARE_PROVIDER_SITE_OTHER): Payer: 59 | Admitting: Physician Assistant

## 2019-11-09 ENCOUNTER — Encounter: Payer: Self-pay | Admitting: Physician Assistant

## 2019-11-09 DIAGNOSIS — F900 Attention-deficit hyperactivity disorder, predominantly inattentive type: Secondary | ICD-10-CM

## 2019-11-09 DIAGNOSIS — F411 Generalized anxiety disorder: Secondary | ICD-10-CM | POA: Diagnosis not present

## 2019-11-09 DIAGNOSIS — F3341 Major depressive disorder, recurrent, in partial remission: Secondary | ICD-10-CM | POA: Diagnosis not present

## 2019-11-09 MED ORDER — TRAZODONE HCL 50 MG PO TABS: 25.0000 mg | ORAL_TABLET | Freq: Every evening | ORAL | 1 refills | Status: DC | PRN

## 2019-11-09 MED ORDER — AMPHETAMINE-DEXTROAMPHET ER 10 MG PO CP24: 10.0000 mg | ORAL_CAPSULE | Freq: Every day | ORAL | 0 refills | Status: DC

## 2019-11-09 MED ORDER — BUPROPION HCL ER (XL) 150 MG PO TB24: 150.0000 mg | ORAL_TABLET | Freq: Every day | ORAL | 1 refills | Status: DC

## 2019-11-09 NOTE — Progress Notes (Signed)
[facility]  Patient ID: Olivia White,  MRN: [id number]  PCP: Patient, No Pcp Per  Date of Evaluation: 11/09/2019 Time spent:20 minutes  Chief Complaint:  Chief Complaint    ADD; Depression      HISTORY/CURRENT STATUS: HPI For routine med check.   "I'm doing great!  I think the medicines are perfect." Work is going great. 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   She is able to enjoy things, has more energy and motivation.  She still has trouble falling asleep at night but it is no worse.  That has been going on for a long time.  Not isolating. No suicidal or homicidal thoughts.  Anxiety is controlled as long as she doesn't have to be around a lot of people. She orders groceries online and anything else she can, just so she won't have to go in a store.   Is more able to concentrate.  Able to stay on task and finish in a timely manner whereas before it was much more difficult.  Denies dizziness, syncope, seizures, numbness, tingling, tremor, tics, unsteady gait, slurred speech, confusion. Denies muscle or joint pain, stiffness, or dystonia.  Individual Medical History/ Review of Systems: Changes? :No    Past medications for mental health diagnoses include: Latuda, Emsam, Vraylar, Adderall, melatonin, Risperdal which she hated for some reason remember, Klonopin, Xanax, Prozac, Zoloft, Lexapro, Celexa, Abilify, Zyprexa, Seroquel, lithium, Lamictal, Tegretol, Effexor, Wellbutrin, Cymbalta, Pristiq  Allergies: Patient has no known allergies.  Current Medications:  Current Outpatient Medications:  .  [START ON 01/28/2020] amphetamine-dextroamphetamine (ADDERALL XR) 10 MG 24 hr capsule, Take 1 capsule (10 mg total) by mouth daily., Disp: 30 capsule, Rfl: 0 .  buPROPion (WELLBUTRIN XL) 150 MG 24 hr tablet, Take 1 tablet (150 mg total) by mouth daily., Disp: 90 tablet, Rfl: 1 .  [START ON 12/30/2019] amphetamine-dextroamphetamine (ADDERALL XR) 10 MG 24 hr capsule, Take 1  capsule (10 mg total) by mouth daily., Disp: 30 capsule, Rfl: 0 .  [START ON 11/30/2019] amphetamine-dextroamphetamine (ADDERALL XR) 10 MG 24 hr capsule, Take 1 capsule (10 mg total) by mouth daily., Disp: 30 capsule, Rfl: 0 .  HYDROcodone-acetaminophen (NORCO/VICODIN) 5-325 MG tablet, Take 1 tablet by mouth every 6 (six) hours as needed for severe pain. (Patient not taking: Reported on 07/08/2018), Disp: 6 tablet, Rfl: 0 .  ondansetron (ZOFRAN ODT) 4 MG disintegrating tablet, Take 1 tablet (4 mg total) by mouth every 8 (eight) hours as needed for nausea or vomiting. (Patient not taking: Reported on 07/08/2018), Disp: 10 tablet, Rfl: 0 .  traZODone (DESYREL) 50 MG tablet, Take 0.5-2 tablets (25-100 mg total) by mouth at bedtime as needed for sleep., Disp: 60 tablet, Rfl: 1 Medication Side Effects: none  Family Medical/ Social History: Changes? No  MENTAL HEALTH EXAM:  There were no vitals taken for this visit.There is no height or weight on file to calculate BMI.  General Appearance: Casual, Neat and Well Groomed  Eye Contact:  Good  Speech:  Clear and Coherent and Normal Rate  Volume:  Normal  Mood:  Euthymic  Affect:  Appropriate  Thought Process:  Goal Directed and Descriptions of Associations: Intact  Orientation:  Full (Time, Place, and Person)  Thought Content: Logical   Suicidal Thoughts:  No  Homicidal Thoughts:  No  Memory:  WNL  Judgement:  Good  Insight:  Good  Psychomotor Activity:  Normal  Concentration:  Concentration: Good and Attention Span: Good  Recall:  Good  Fund of Knowledge:  Good  Language: Good  Assets:  Desire for Improvement  ADL's:  Intact  Cognition: WNL  Prognosis:  Good    DIAGNOSES:    ICD-10-CM   1. Attention deficit hyperactivity disorder (ADHD), predominantly inattentive type  F90.0   2. Generalized anxiety disorder  F41.1   3. Recurrent major depressive disorder, in partial remission ([place])  F33.41     Receiving Psychotherapy: No     RECOMMENDATIONS:  PDMP reviewed.  I provided 20 minutes of face to face time during this encounter.  Sleep hygiene discussed.  Discussed medication options to help with sleep.  Melatonin hasn't worked in the past, so I recommend either Trazodone or Mirtazapine.  Benefits, risks, and SE discussed and she accepts. Start Trazodone 50 mg, 1/2-2 po qhs prn sleep. Continue Wellbutrin XL 150 mg, 1 q am. Cont Adderall XR 10 mg, 1 q am. Again discussed the possibility of changing Adderall XR to Adzenys or Cotempla, for cost savings but she prefers to leave it the same for now.  We discussed [facility] as well.  She might want to change pharmacies later, and that will be fine.   Return in 3 months  Donnal Moat, [place]

## 2019-12-11 ENCOUNTER — Telehealth: Payer: Self-pay | Admitting: Physician Assistant

## 2019-12-11 NOTE — Telephone Encounter (Signed)
Pt called and needs his Adderall [facility] [address] [place], [place] [address].This is the pharmacy he is using now.

## 2019-12-12 ENCOUNTER — Other Ambulatory Visit: Payer: Self-pay

## 2019-12-12 MED ORDER — AMPHETAMINE-DEXTROAMPHET ER 10 MG PO CP24: 10.0000 mg | ORAL_CAPSULE | Freq: Every day | ORAL | 0 refills | Status: DC

## 2019-12-12 NOTE — Telephone Encounter (Signed)
Requesting a change in pharmacy. Will call and cancel then pend for Helene Kelp to review.

## 2019-12-12 NOTE — Telephone Encounter (Signed)
Last refill 10/31/19 Pended next 3 Rx's for change in pharmacy.

## 2020-01-22 ENCOUNTER — Telehealth: Payer: Self-pay | Admitting: Physician Assistant

## 2020-01-22 NOTE — Telephone Encounter (Signed)
Pt called for a refill on [facility] [address] [facility] [place]. Please make this pharmacy the primary one. [phone]. She is going to call back to schedule her appt.

## 2020-01-23 ENCOUNTER — Other Ambulatory Visit: Payer: Self-pay

## 2020-01-23 MED ORDER — AMPHETAMINE-DEXTROAMPHET ER 10 MG PO CP24: 10.0000 mg | ORAL_CAPSULE | Freq: Every day | ORAL | 0 refills | Status: DC

## 2020-01-23 NOTE — Telephone Encounter (Signed)
Change in pharmacy, last refill 12/12/2019  Will pend for Olivia White and change her pharmacy to [facility]

## 2020-01-23 NOTE — Telephone Encounter (Signed)
Noted  

## 2020-02-13 ENCOUNTER — Telehealth: Payer: Self-pay | Admitting: Physician Assistant

## 2020-02-13 NOTE — Telephone Encounter (Signed)
Received FMLA form from Olivia White for her employer, [employer]. Charles has a f/u appt on 12/6 with Helene Kelp. She would like the paperwork faxed please.

## 2020-02-19 DIAGNOSIS — Z0289 Encounter for other administrative examinations: Secondary | ICD-10-CM

## 2020-02-19 NOTE — Telephone Encounter (Signed)
Forms completed and given to Helene Kelp to review and sign

## 2020-02-19 NOTE — Telephone Encounter (Signed)
Form signed and put in Admin's box

## 2020-03-18 ENCOUNTER — Ambulatory Visit: Payer: 59 | Admitting: Physician Assistant

## 2020-04-16 ENCOUNTER — Telehealth: Payer: Self-pay | Admitting: Physician Assistant

## 2020-04-16 NOTE — Telephone Encounter (Signed)
Pt called and asked if the ns fee could be waived. She was trying to get enough money to make an appointment and  Told one of the receptionist not to schedule and they did anyway. She will not make another appointment until this fee is waived. She said that she will pay the rest of her balance. Please let her know at [phone]

## 2020-04-16 NOTE — Telephone Encounter (Signed)
Yes you did  write a ns fee for her in march

## 2020-04-16 NOTE — Telephone Encounter (Signed)
Thanks for looking that up. No, I will not waive this no-show fee. Please let her know that and remind her that I waived 1 no-show fee already.

## 2020-04-16 NOTE — Telephone Encounter (Signed)
Have I waived a no-show fee on her before?  Are you able to see that?

## 2020-04-17 ENCOUNTER — Other Ambulatory Visit: Payer: Self-pay | Admitting: Physician Assistant

## 2020-04-17 ENCOUNTER — Telehealth: Payer: Self-pay | Admitting: Physician Assistant

## 2020-04-17 MED ORDER — AMPHETAMINE-DEXTROAMPHET ER 10 MG PO CP24: 10.0000 mg | ORAL_CAPSULE | Freq: Every day | ORAL | 0 refills | Status: DC

## 2020-04-17 MED ORDER — BUPROPION HCL ER (XL) 150 MG PO TB24: 150.0000 mg | ORAL_TABLET | Freq: Every day | ORAL | 0 refills | Status: DC

## 2020-04-17 NOTE — Telephone Encounter (Signed)
I talked to the pt and she did make an appt with you for February 23rd. She has covid and has been out of work 14 days. She needs a refill on her wellbutrin xl 150 mg and [facility]all [facility]r [facility] [address]

## 2020-04-17 NOTE — Telephone Encounter (Signed)
I understand, that's her choice. Her return appointments are not optional. These appointments are to follow up on how she is and how she's responding to treatment. Patients are aware they will incur a no show fee when they do not cancel prior to 24 hours of appt, without a good reason.

## 2020-04-17 NOTE — Telephone Encounter (Signed)
Prescriptions were sent.  I will watch for further missed appointments.

## 2020-04-17 NOTE — Telephone Encounter (Signed)
I will let pt know but she told me that if it was not waived she would not schedule an appointment unless the fee was waived

## 2020-06-05 ENCOUNTER — Ambulatory Visit: Payer: 59 | Admitting: Physician Assistant

## 2020-06-13 ENCOUNTER — Ambulatory Visit: Payer: 59 | Admitting: Physician Assistant

## 2020-08-15 ENCOUNTER — Telehealth: Payer: Self-pay | Admitting: Physician Assistant

## 2020-08-15 NOTE — Telephone Encounter (Signed)
Please review

## 2020-08-15 NOTE — Telephone Encounter (Signed)
Can you look and see if she ns or cancelled the 06/05/20 appt? Thanks

## 2020-08-15 NOTE — Telephone Encounter (Signed)
Pt made appt for 10/09/20. Pt said that she had to stop coming because of financial reasons and has been trying her best to make it without her medication. Pt would like to know if she could get a refill on Wellbutrin and Adderall since she is doing better financially. [facility] [address]

## 2020-08-16 ENCOUNTER — Other Ambulatory Visit: Payer: Self-pay | Admitting: Physician Assistant

## 2020-08-16 MED ORDER — BUPROPION HCL ER (XL) 150 MG PO TB24: 150.0000 mg | ORAL_TABLET | Freq: Every day | ORAL | 0 refills | Status: DC

## 2020-08-16 NOTE — Telephone Encounter (Signed)
Please add to cancellation list

## 2020-08-16 NOTE — Telephone Encounter (Signed)
Please let her know that I have sent in the Wellbutrin.  Unable to give Adderall without seeing her.

## 2020-10-09 ENCOUNTER — Ambulatory Visit: Payer: 59 | Admitting: Physician Assistant

## 2020-11-21 ENCOUNTER — Encounter: Payer: Self-pay | Admitting: Physician Assistant

## 2020-11-21 ENCOUNTER — Ambulatory Visit: Payer: 59 | Admitting: Physician Assistant

## 2020-11-21 ENCOUNTER — Other Ambulatory Visit: Payer: Self-pay

## 2020-11-21 DIAGNOSIS — F411 Generalized anxiety disorder: Secondary | ICD-10-CM

## 2020-11-21 DIAGNOSIS — F900 Attention-deficit hyperactivity disorder, predominantly inattentive type: Secondary | ICD-10-CM

## 2020-11-21 DIAGNOSIS — F331 Major depressive disorder, recurrent, moderate: Secondary | ICD-10-CM | POA: Diagnosis not present

## 2020-11-21 MED ORDER — AMPHETAMINE-DEXTROAMPHET ER 10 MG PO CP24: 10.0000 mg | ORAL_CAPSULE | Freq: Every day | ORAL | 0 refills | Status: DC

## 2020-11-21 NOTE — Progress Notes (Signed)
[facility]  Patient ID: Olivia White,  MRN: [id number]  PCP: Patient, No Pcp Per (Inactive)  Date of Evaluation: 11/21/2020 Time spent:30 minutes  Chief Complaint:  Chief Complaint   ADD; Depression; Follow-up     HISTORY/CURRENT STATUS: Not doing well.   Has been off her meds for several months due to cost.  She was not able to afford coming in and it has been a little over a year since her last visit.  Anxiety is controlled unless she has to be around a lot of people.  Now that she has not been able to focus very well she gets more anxious with that too.  The ADD symptoms are really affecting her work.  She has a hard time staying on task and finishing something in a timely manner.  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  She sleeps pretty good for the most part.  She is able to enjoy things.  Energy and motivation are lacking some since being off of the Wellbutrin.  Appetite is normal and weight is stable.  Not isolating.  No suicidal or homicidal thoughts.  Denies dizziness, syncope, seizures, numbness, tingling, tremor, tics, unsteady gait, slurred speech, confusion. Denies muscle or joint pain, stiffness, or dystonia.  Individual Medical History/ Review of Systems: Changes? :No    Past medications for mental health diagnoses include: Latuda, Emsam, Vraylar, Adderall, melatonin, Risperdal which she hated for some reason remember, Klonopin, Xanax, Prozac, Zoloft, Lexapro, Celexa, Abilify, Zyprexa, Seroquel, lithium, Lamictal, Tegretol, Effexor, Wellbutrin, Cymbalta, Pristiq  Allergies: Patient has no known allergies.  Current Medications:  Current Outpatient Medications:    traZODone (DESYREL) 50 MG tablet, Take 0.5-2 tablets (25-100 mg total) by mouth at bedtime as needed for sleep., Disp: 60 tablet, Rfl: 1   [START ON 01/18/2021] amphetamine-dextroamphetamine (ADDERALL XR) 10 MG 24 hr capsule, Take 1 capsule (10 mg total) by mouth  daily., Disp: 30 capsule, Rfl: 0   [START ON 12/21/2020] amphetamine-dextroamphetamine (ADDERALL XR) 10 MG 24 hr capsule, Take 1 capsule (10 mg total) by mouth daily., Disp: 30 capsule, Rfl: 0   amphetamine-dextroamphetamine (ADDERALL XR) 10 MG 24 hr capsule, Take 1 capsule (10 mg total) by mouth daily., Disp: 30 capsule, Rfl: 0   buPROPion (WELLBUTRIN XL) 150 MG 24 hr tablet, Take 1 tablet (150 mg total) by mouth daily. (Patient not taking: Reported on 11/21/2020), Disp: 90 tablet, Rfl: 0   HYDROcodone-acetaminophen (NORCO/VICODIN) 5-325 MG tablet, Take 1 tablet by mouth every 6 (six) hours as needed for severe pain. (Patient not taking: No sig reported), Disp: 6 tablet, Rfl: 0   ondansetron (ZOFRAN ODT) 4 MG disintegrating tablet, Take 1 tablet (4 mg total) by mouth every 8 (eight) hours as needed for nausea or vomiting. (Patient not taking: No sig reported), Disp: 10 tablet, Rfl: 0 Medication Side Effects: none  Family Medical/ Social History: Changes?  She and her husband are in a custody battle for their 56-year-old son.  So that is stressful. Has had several "occurrences" at work due to being absent even if only 1 hour or a whole day because of the depression and lack of concentration.  She will need FMLA filled out again.  MENTAL HEALTH EXAM:  There were no vitals taken for this visit.There is no height or weight on file to calculate BMI.  General Appearance: Casual, Neat and Well Groomed  Eye Contact:  Good  Speech:  Clear and Coherent and Normal Rate  Volume:  Normal  Mood:  Euthymic  Affect:  Appropriate  Thought Process:  Goal Directed and Descriptions of Associations: Intact  Orientation:  Full (Time, Place, and Person)  Thought Content: Logical   Suicidal Thoughts:  No  Homicidal Thoughts:  No  Memory:  WNL  Judgement:  Good  Insight:  Good  Psychomotor Activity:  Normal  Concentration:  Concentration: Fair and Attention Span: Fair  Recall:  Good  Fund of Knowledge: Good   Language: Good  Assets:  Desire for Improvement  ADL's:  Intact  Cognition: WNL  Prognosis:  Good    DIAGNOSES:    ICD-10-CM   1. Attention deficit hyperactivity disorder (ADHD), predominantly inattentive type  F90.0     2. Major depressive disorder, recurrent episode, moderate (HCC)  F33.1     3. Generalized anxiety disorder  F41.1        Receiving Psychotherapy: No    RECOMMENDATIONS:  PDMP reviewed.  I provided 30 minutes of face to face time during this encounter, including time spent before and after the visit in records review, medical decision making, counseling concerning treatment options/medication changes as appropriate, and charting.  For some reason she was not aware that a prescription for Wellbutrin was sent in 3 months ago.  So she will restart that prescription, along with restarting the Adderall.  We again discussed the benefits, risks, side effects of both treatments and she accepts. As far as her job goes FMLA will be filled out, okay to have 4 occurrences per week (not 4 days off though, but possibly a few hours and maybe 1 day a week at most) up until our next visit and then we will need to readdress this.  She is having trouble with HR but she plans to appeal their decision on the recent occurrences.  If I need to change FMLA then she will let me know.  Plus I agreed to charge her only the initial FMLA paperwork for me.  If it has to be repeated within the next 3 months there will be no charge. She asked about a random drug screen, whether it is done at work or not and what she did she say about the Adderall.  She definitely needs to report that she is on Adderall because it will show up on the drug screen.  I recommend that she take the current Adderall bottle with her, with the correct pill count, when she has to go to employee health.   Restart Adderall XR 10 mg, 1 p.o. every morning after breakfast. Restart Wellbutrin XL 150 mg, 1 p.o. every morning. Return  in 3 months  Donnal Moat, [place]

## 2021-01-24 ENCOUNTER — Telehealth: Payer: Self-pay | Admitting: Physician Assistant

## 2021-01-24 NOTE — Telephone Encounter (Signed)
PT called pharmacy and was told she that she needed to call us to get Helene Kelp to reauthorize the Adderall (even though they have the 3 scripts on hand).  She didn't pick up the Sep script and now they are saying for the Oct script they need Teresa's approval to fill it now.  She wasn't exactly sure what they were going to send here for Helene Kelp, rather than wait for something to come in to [place], she's asking [place] to find out what's needed in order for her to get the Adderall now.

## 2021-01-24 NOTE — Telephone Encounter (Signed)
Noted thanks will submit as soon as I can

## 2021-01-24 NOTE — Telephone Encounter (Signed)
Pharmacy stated she needs a PA

## 2021-01-24 NOTE — Telephone Encounter (Signed)
Next appt 11/10

## 2021-01-27 NOTE — Telephone Encounter (Signed)
Please inform pt

## 2021-01-27 NOTE — Telephone Encounter (Signed)
Prior Approval received for ADDERALL XR 10 MG effective 01/27/2021-01/27/2022, PA# [id number] with [facility]. She should now be able to fill her Rx.

## 2021-01-27 NOTE — Telephone Encounter (Signed)
Called patient again at 4:30 and got message again to VM box is full.

## 2021-01-27 NOTE — Telephone Encounter (Signed)
Prior Authorization submitted for ADDERALL XR 10 MG  with [facility] ID# [id number]  Pending response

## 2021-02-20 ENCOUNTER — Ambulatory Visit (INDEPENDENT_AMBULATORY_CARE_PROVIDER_SITE_OTHER): Payer: 59 | Admitting: Physician Assistant

## 2021-02-20 ENCOUNTER — Encounter: Payer: Self-pay | Admitting: Physician Assistant

## 2021-02-20 ENCOUNTER — Other Ambulatory Visit: Payer: Self-pay

## 2021-02-20 DIAGNOSIS — F900 Attention-deficit hyperactivity disorder, predominantly inattentive type: Secondary | ICD-10-CM | POA: Diagnosis not present

## 2021-02-20 MED ORDER — AMPHETAMINE-DEXTROAMPHET ER 5 MG PO CP24: 5.0000 mg | ORAL_CAPSULE | Freq: Every day | ORAL | 0 refills | Status: DC

## 2021-02-20 NOTE — Progress Notes (Signed)
[facility]  Patient ID: Olivia White,  MRN: [id number]  PCP: Patient, No Pcp Per (Inactive)  Date of Evaluation: 02/20/2021 Time spent:20 minutes  Chief Complaint:  Chief Complaint   Depression; ADD; Anxiety; Follow-up      HISTORY/CURRENT STATUS:   Wants to decrease the Adderall XR. Would like to try the 5mg  if that would be okay.  She does not really want to be on this medication forever and is trying to learn some other techniques to see if she can work through the ADD more on her own.  Still somewhat depressed but overall she is doing well.  Has not felt like the Wellbutrin did much of anything so she is only been taking it sporadically.  She is able to enjoy things.  Energy and motivation are pretty good.  She does miss work sometimes just by few minutes or a few hours a day and needs FMLA to help make sure her job is safe.  Appetite is normal and weight is stable.  Not isolating.  She is sleeping well most of the time.  Anxiety is not a problem.  No suicidal or homicidal thoughts.  Denies dizziness, syncope, seizures, numbness, tingling, tremor, tics, unsteady gait, slurred speech, confusion. Denies muscle or joint pain, stiffness, or dystonia.  Individual Medical History/ Review of Systems: Changes? :No    Past medications for mental health diagnoses include: Latuda, Emsam, Vraylar, Adderall, melatonin, Risperdal which she hated for some reason remember, Klonopin, Xanax, Prozac, Zoloft, Lexapro, Celexa, Abilify, Zyprexa, Seroquel, lithium, Lamictal, Tegretol, Effexor, Wellbutrin, Cymbalta, Pristiq  Allergies: Patient has no known allergies.  Current Medications:  Current Outpatient Medications:    amphetamine-dextroamphetamine (ADDERALL XR) 5 MG 24 hr capsule, Take 1 capsule (5 mg total) by mouth daily., Disp: 30 capsule, Rfl: 0   buPROPion (WELLBUTRIN XL) 150 MG 24 hr tablet, Take 1 tablet (150 mg total) by mouth daily. (Patient not taking: Reported on  11/21/2020), Disp: 90 tablet, Rfl: 0   HYDROcodone-acetaminophen (NORCO/VICODIN) 5-325 MG tablet, Take 1 tablet by mouth every 6 (six) hours as needed for severe pain. (Patient not taking: No sig reported), Disp: 6 tablet, Rfl: 0   ondansetron (ZOFRAN ODT) 4 MG disintegrating tablet, Take 1 tablet (4 mg total) by mouth every 8 (eight) hours as needed for nausea or vomiting. (Patient not taking: No sig reported), Disp: 10 tablet, Rfl: 0   traZODone (DESYREL) 50 MG tablet, Take 0.5-2 tablets (25-100 mg total) by mouth at bedtime as needed for sleep., Disp: 60 tablet, Rfl: 1 Medication Side Effects: none  Family Medical/ Social History: Changes?  Has filed for full custody of her son.   MENTAL HEALTH EXAM:  There were no vitals taken for this visit.There is no height or weight on file to calculate BMI.  General Appearance: Casual, Neat and Well Groomed  Eye Contact:  Good  Speech:  Clear and Coherent and Normal Rate  Volume:  Normal  Mood:  Euthymic  Affect:  Appropriate  Thought Process:  Goal Directed and Descriptions of Associations: Intact  Orientation:  Full (Time, Place, and Person)  Thought Content: Logical   Suicidal Thoughts:  No  Homicidal Thoughts:  No  Memory:  WNL  Judgement:  Good  Insight:  Good  Psychomotor Activity:  Normal  Concentration:  Concentration: Good and Attention Span: Good  Recall:  Good  Fund of Knowledge: Good  Language: Good  Assets:  Desire for Improvement  ADL's:  Intact  Cognition: WNL  Prognosis:  Good    DIAGNOSES:    ICD-10-CM   1. Attention deficit hyperactivity disorder (ADHD), predominantly inattentive type  F90.0         Receiving Psychotherapy: No    RECOMMENDATIONS:  PDMP reviewed.  Last Adderall filled 01/31/2021. I provided 20 minutes of face to face time during this encounter, including time spent before and after the visit in records review, medical decision making, and charting.  I am glad to see her doing so well!  She  really prefers not to take anything for depression as it seems that the Adderall has helped lift her mood as well. We discussed decreasing the dose of the Adderall.  I am fine with that and we will only try 1 month supply to begin with. Discontinue Wellbutrin since it is not helping and it does not seem that she really needs it now. Start Adderall XR 5 mg, 1 po q am. She'll call in 3 weeks to let me know how it's working or not, if so will send in 3 Rx, if not will send 3 Rx of the 10 mg.  FMLA intermittent. Same as in past few years.  Return in 6 months  Donnal Moat, [place]

## 2021-03-03 ENCOUNTER — Telehealth: Payer: Self-pay | Admitting: Physician Assistant

## 2021-03-03 NOTE — Telephone Encounter (Signed)
Received will work on them as soon as possible

## 2021-03-03 NOTE — Telephone Encounter (Signed)
Received FMLA forms. Patient needs forms ASAP. Also noted that information can be used from last years FMLA form. Put in Traci's box 11/21

## 2021-03-04 DIAGNOSIS — Z0289 Encounter for other administrative examinations: Secondary | ICD-10-CM

## 2021-03-04 NOTE — Telephone Encounter (Signed)
This has been completed, signed and faxed today

## 2021-12-01 ENCOUNTER — Telehealth: Payer: Self-pay | Admitting: Physician Assistant

## 2021-12-01 NOTE — Telephone Encounter (Signed)
Please call patient. She needs the earliest possible appt with Helene Kelp.

## 2021-12-01 NOTE — Telephone Encounter (Signed)
LVM to RC 

## 2021-12-01 NOTE — Telephone Encounter (Signed)
Patient lvm at 10:15 requesting a return call. Ph: [phone]

## 2021-12-02 NOTE — Telephone Encounter (Signed)
Patient only takes Adderall when needed and has not been in for an appt because she had medication. She needs to take a drug test for a new job and she is also in a custody battle and will require a drug screen for that as well.  She does not have a valid Rx.  She was last seen 02/2021.

## 2021-12-02 NOTE — Telephone Encounter (Signed)
Pt is scheduled 8/24

## 2021-12-04 ENCOUNTER — Encounter: Payer: Self-pay | Admitting: Physician Assistant

## 2021-12-04 ENCOUNTER — Ambulatory Visit (INDEPENDENT_AMBULATORY_CARE_PROVIDER_SITE_OTHER): Payer: 59 | Admitting: Physician Assistant

## 2021-12-04 DIAGNOSIS — F900 Attention-deficit hyperactivity disorder, predominantly inattentive type: Secondary | ICD-10-CM | POA: Diagnosis not present

## 2021-12-04 DIAGNOSIS — F411 Generalized anxiety disorder: Secondary | ICD-10-CM

## 2021-12-04 MED ORDER — AMPHETAMINE-DEXTROAMPHET ER 5 MG PO CP24: 5.0000 mg | ORAL_CAPSULE | Freq: Every day | ORAL | 0 refills | Status: DC | PRN

## 2021-12-04 NOTE — Progress Notes (Signed)
[facility]  Patient ID: Garrett Bowring,  MRN: [id number]  PCP: Patient, No Pcp Per  Date of Evaluation: 12/04/2021 Time spent:20 minutes  Chief Complaint:  Chief Complaint   ADD; Follow-up    HISTORY/CURRENT STATUS: For routine follow-up.  3 months overdue for appointment.  Has been only taking the Adderall as needed. Still has some left from bottle filled last fall. The current dose is working great. Doesn't feel like she needs a higher dose. Doesn't want to take it every day if not needed. Has random drug screens at work and is afraid if she doesn't have a current Rx, and the screen shows up positive, it might cause a problem, so would like new Rx.  Also in a custody battle for her son, his dad is using the fact that she takes the medicine against her so wants a new prescription for that reason as well.   The anxiety and depressive symptoms are well controlled without any medication right now.  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  Sometimes they happen for no reason.  During those times, she needs to take a break from work sometimes for as long as an hour before she can go on to the next customer's house.  She does not feel the need to take any medication for this.  She is not missing days at work, needs this to be specified on the [facility] because they are counting these times against her and she is being penalized for decreased productivity.  Patient is able to enjoy things.  Energy and motivation are good. No extreme sadness, tearfulness, or feelings of hopelessness.  Sleeps well most of the time. ADLs and personal hygiene are normal.   Denies any changes in concentration or memory.  Denies suicidal or homicidal thoughts.  Patient denies increased energy with  decreased need for sleep, increased talkativeness, racing thoughts, impulsivity or risky behaviors, increased spending, increased libido, grandiosity, increased irritability or anger, paranoia, and no hallucinations.  Denies dizziness, syncope, seizures, numbness, tingling, tremor, tics, unsteady gait, slurred speech, confusion. Denies muscle or joint pain, stiffness, or dystonia.  Individual Medical History/ Review of Systems: Changes? :Yes    had a tooth pulled recently, on hydrocodone as needed.  Past medications for mental health diagnoses include: Latuda, Emsam, Vraylar, Adderall, melatonin, Risperdal which she hated for some reason remember, Klonopin, Xanax, Prozac, Zoloft, Lexapro, Celexa, Abilify, Zyprexa, Seroquel, lithium, Lamictal, Tegretol, Effexor, Wellbutrin, Cymbalta, Pristiq  Allergies: Patient has no known allergies.  Current Medications:  Current Outpatient Medications:    HYDROcodone-acetaminophen (NORCO/VICODIN) 5-325 MG tablet, Take 1 tablet by mouth every 6 (six) hours as needed for severe pain., Disp: 6 tablet, Rfl: 0   amphetamine-dextroamphetamine (ADDERALL XR) 5 MG 24 hr capsule, Take 1 capsule (5 mg total) by mouth daily as needed. prn, Disp: 30 capsule, Rfl: 0   ondansetron (ZOFRAN ODT) 4 MG disintegrating tablet, Take 1 tablet (4 mg total) by mouth every 8 (eight) hours as needed for nausea or vomiting. (Patient not taking: Reported on 07/08/2018), Disp: 10 tablet, Rfl: 0   traZODone (DESYREL) 50 MG tablet, Take 0.5-2 tablets (25-100 mg total) by mouth at bedtime as needed for sleep. (Patient not taking: Reported on 12/04/2021), Disp: 60  tablet, Rfl: 1 Medication Side Effects: none  Family Medical/ Social History: Changes?  See HPI  MENTAL HEALTH EXAM:  There were no vitals taken for this visit.There is no height or weight on file to calculate BMI.  General Appearance: Casual, Neat and Well Groomed  Eye Contact:  Good  Speech:  Clear and Coherent and Normal Rate   Volume:  Normal  Mood:  Euthymic  Affect:  Appropriate  Thought Process:  Goal Directed and Descriptions of Associations: Circumstantial  Orientation:  Full (Time, Place, and Person)  Thought Content: Logical   Suicidal Thoughts:  No  Homicidal Thoughts:  No  Memory:  WNL  Judgement:  Good  Insight:  Good  Psychomotor Activity:  Normal  Concentration:  Concentration: Good and Attention Span: Good  Recall:  Good  Fund of Knowledge: Good  Language: Good  Assets:  Desire for Improvement  ADL's:  Intact  Cognition: WNL  Prognosis:  Good   DIAGNOSES:    ICD-10-CM   1. Attention deficit hyperactivity disorder (ADHD), predominantly inattentive type  F90.0     2. Generalized anxiety disorder  F41.1       Receiving Psychotherapy: No   RECOMMENDATIONS:  PDMP reviewed.  Given a small amount of Valium on 11/19/2021 and hydrocodone on 11/20/2021 by Dr. Tora Duck.  Last Adderall filled 01/31/2021. I provided 20 minutes of face to face time during this encounter, including time spent before and after the visit in records review, medical decision making, counseling pertinent to today's visit, and charting.   She is doing well and taking the Adderall as needed is great.  Continue Adderall XR 5 mg, 1 po q am prn. Will need FMLA renewed in November, but will ask HR if she can go ahead and have it filled out, then she will not need it until 12 months from now.  Will need more specific accommodations d/t changes in management. Will need start at 8:00 AM. Continue 3 times per week and 1 day per episode (she doesn't take days but has to take time during the day if she has a PA.)  Return in 6 months  Donnal Moat, PA-C

## 2022-03-11 ENCOUNTER — Telehealth: Payer: Self-pay | Admitting: Physician Assistant

## 2022-03-11 NOTE — Telephone Encounter (Signed)
Geisha Abernathy dropped off an [employer] form for [employer]. Placed in Traci's box to complete.

## 2022-03-19 DIAGNOSIS — Z0289 Encounter for other administrative examinations: Secondary | ICD-10-CM

## 2022-03-20 NOTE — Telephone Encounter (Signed)
Form completed and will have Dr. Clovis Pu to sign.

## 2023-11-01 LAB — HM PAP SMEAR: HM Pap smear: NORMAL

## 2023-11-09 ENCOUNTER — Encounter: Payer: Self-pay | Admitting: Family Medicine

## 2023-11-09 ENCOUNTER — Ambulatory Visit (INDEPENDENT_AMBULATORY_CARE_PROVIDER_SITE_OTHER): Admitting: Family Medicine

## 2023-11-09 VITALS — BP 106/71 | HR 75 | Temp 98.5°F | Ht 66.0 in | Wt 134.7 lb

## 2023-11-09 DIAGNOSIS — Z7689 Persons encountering health services in other specified circumstances: Secondary | ICD-10-CM | POA: Diagnosis not present

## 2023-11-09 DIAGNOSIS — R5383 Other fatigue: Secondary | ICD-10-CM | POA: Diagnosis not present

## 2023-11-09 NOTE — Progress Notes (Signed)
 New Patient Office Visit  Subjective    Patient ID: Olivia White, female    DOB: October 04, 1990  Age: 33 y.o. MRN: [id number]  CC:  Chief Complaint  Patient presents with   Establish Care    HPI Kyrstyn Greear presents to establish care with this practice. She is new to me. Started feeling sick on 7/4 thought she was getting sick. Nausea, dizziness, fatigue, weakness, headache, ringing in ears. Chest pain: points to area on left side of chest. Shortness of breath: exercises regularly. Works in the office. Reports feeling SOB at rest.  No obvious SOB in office.  Started taking iron, was told her iron was normal. CBC does not show anemia. Iron level was not checked at [facility].  Two days ago, she started feeling better. She is 80% back to herself.  History of panic disorder.  PHQ-9: feels hopeless due to not having money to throw at it Doesn't think depression is causing symptoms.    Health maintenance:  Pap smear last week with GYN. [facility]  Chart review: 10/31/23: UC for fatigue: symptoms since 7/4 Labs: CBC: normal  CMP: GFR 88, k+ 4.2, liver: normal Thyroid: normal,  A1C: 5.1     Outpatient Encounter Medications as of 11/09/2023  Medication Sig   [DISCONTINUED] amphetamine -dextroamphetamine  (ADDERALL XR) 5 MG 24 hr capsule Take 1 capsule (5 mg total) by mouth daily as needed. prn (Patient not taking: Reported on 11/09/2023)   [DISCONTINUED] etonogestrel (NEXPLANON) 68 MG IMPL implant 68 mg by Subdermal route once. (Patient not taking: Reported on 11/09/2023)   [DISCONTINUED] HYDROcodone -acetaminophen  (NORCO/VICODIN) 5-325 MG tablet Take 1 tablet by mouth every 6 (six) hours as needed for severe pain. (Patient not taking: Reported on 11/09/2023)   [DISCONTINUED] ondansetron  (ZOFRAN  ODT) 4 MG disintegrating tablet Take 1 tablet (4 mg total) by mouth every 8 (eight) hours as needed for nausea or vomiting. (Patient not taking: Reported on 07/08/2018)   [DISCONTINUED]  ondansetron  (ZOFRAN ) 4 MG tablet Take 4 mg by mouth every 8 (eight) hours as needed for nausea. (Patient not taking: Reported on 11/09/2023)   No facility-administered encounter medications on file as of 11/09/2023.    History reviewed. No pertinent past medical history.  History reviewed. No pertinent surgical history.  History reviewed. No pertinent family history.  Social History   Socioeconomic History   Marital status: Legally Separated    Spouse name: Not on file   Number of children: Not on file   Years of education: Not on file   Highest education level: Not on file  Occupational History   Not on file  Tobacco Use   Smoking status: Never   Smokeless tobacco: Never  Vaping Use   Vaping status: Never Used  Substance and Sexual Activity   Alcohol use: Not on file   Drug use: Not Currently   Sexual activity: Not on file  Other Topics Concern   Not on file  Social History Narrative   Not on file   Social Drivers of Health   Financial Resource Strain: Low Risk  (11/09/2023)   Overall Financial Resource Strain (CARDIA)    Difficulty of Paying Living Expenses: Not hard at all  Food Insecurity: Not on file  Transportation Needs: No Transportation Needs (11/09/2023)   PRAPARE - Administrator, Civil Service (Medical): No    Lack of Transportation (Non-Medical): No  Physical Activity: Not on file  Stress: Stress Concern Present (11/09/2023)   [employer] of Occupational Health - Occupational  Stress Questionnaire    Feeling of Stress: Very much  Social Connections: Unknown (11/09/2023)   Social Connection and Isolation Panel    Frequency of Communication with Friends and Family: Never    Frequency of Social Gatherings with Friends and Family: Never    Attends Religious Services: Not on Marketing executive or Organizations: No    Attends Banker Meetings: Never    Marital Status: Divorced  Catering manager Violence: Not At Risk  (11/09/2023)   Humiliation, Afraid, Rape, and Kick questionnaire    Fear of Current or Ex-Partner: No    Emotionally Abused: No    Physically Abused: No    Sexually Abused: No    ROS      Objective    BP 106/71 (BP Location: Left Arm, Patient Position: Sitting, Cuff Size: Small)   Pulse 75   Temp 98.5 F (36.9 C) (Oral)   Ht 5' 6 (1.676 m)   Wt 134 lb 11.2 oz (61.1 kg)   LMP  (LMP Unknown)   SpO2 99%   BMI 21.74 kg/m   Physical Exam Vitals and nursing note reviewed.  Constitutional:      General: She is not in acute distress.    Appearance: Normal appearance. She is normal weight.  Cardiovascular:     Rate and Rhythm: Normal rate and regular rhythm.     Heart sounds: Normal heart sounds.  Pulmonary:     Effort: Pulmonary effort is normal.     Breath sounds: Normal breath sounds.  Skin:    General: Skin is warm and dry.  Neurological:     General: No focal deficit present.     Mental Status: She is alert. Mental status is at baseline.  Psychiatric:        Mood and Affect: Mood normal.        Behavior: Behavior normal.        Thought Content: Thought content normal.        Judgment: Judgment normal.         Assessment & Plan:   Problem List Items Addressed This Visit     Establishing care with new doctor, encounter for - Primary   Other fatigue   Reports fatigue, dizziness, weakness, headache and ringing in the ears starting around 7/4. Chest pain pointing to specific area above left breast.  Went to urgent care and labs were normal. She has high deductible insurance and would like to avoid further testing. This is reasonable since she reports to feeling 80% back to her normal. Will continue to monitor herself and return if she does not return to 100 %. Will check iron and vitamin B 12 today to cover all bases.  Treatment depends on results. Follow-up as needed.        Relevant Orders   B12 and Folate Panel   Iron, TIBC and Ferritin Panel   Agrees with plan of care discussed.  Questions answered.   Return if symptoms worsen or fail to improve.   Darice JONELLE Brownie, FNP

## 2023-11-09 NOTE — Assessment & Plan Note (Addendum)
 Reports fatigue, dizziness, weakness, headache and ringing in the ears starting around 7/4. Chest pain pointing to specific area above left breast.  Went to urgent care and labs were normal. She has high deductible insurance and would like to avoid further testing. This is reasonable since she reports to feeling 80% back to her normal. Will continue to monitor herself and return if she does not return to 100 %. Will check iron and vitamin B 12 today to cover all bases.  Treatment depends on results. Follow-up as needed.

## 2023-11-10 ENCOUNTER — Ambulatory Visit: Payer: Self-pay | Admitting: Family Medicine

## 2023-11-10 ENCOUNTER — Other Ambulatory Visit: Payer: Self-pay | Admitting: Family Medicine

## 2023-11-10 DIAGNOSIS — R638 Other symptoms and signs concerning food and fluid intake: Secondary | ICD-10-CM | POA: Insufficient documentation

## 2023-11-10 LAB — IRON,TIBC AND FERRITIN PANEL
Ferritin: 50 ng/mL (ref 15–150)
Iron Saturation: 56 % — ABNORMAL HIGH (ref 15–55)
Iron: 180 ug/dL — ABNORMAL HIGH (ref 27–159)
Total Iron Binding Capacity: 321 ug/dL (ref 250–450)
UIBC: 141 ug/dL (ref 131–425)

## 2023-11-10 LAB — B12 AND FOLATE PANEL
Folate: >20 ng/mL (ref >3.0)
Vitamin B-12: 555 pg/mL (ref 232–1245)

## 2023-11-10 NOTE — Telephone Encounter (Signed)
-----   Message from Darice JONELLE Brownie sent at 11/10/2023 11:39 AM EDT ----- Please notify [place]. The iron level is elevated. Please stop all iron supplements and I would like to recheck in one month.  Please stop by the office around 8/30 and inform front desk you have lab. It is best to arrive between 0800-1100 Monday-Friday.  B12 and Folate are normal.  Thanks, Darice  ----- Message ----- From: Interface, [facility] Lab Results In Sent: 11/10/2023   9:36 AM EDT To: Darice JONELLE Brownie, FNP

## 2023-11-10 NOTE — Telephone Encounter (Signed)
 I spoke with the patient and gave her the results of her blood test regarding the elevation of her iron level and that she was to stop all iron supplements. She was told to come by the office/lab the week of 8/30, M-F between 8:00 and 11:00 am for repeat blood tests. Patient was told her B12 and Folate were normal. She asked if her Ferritin was normal and I informed her that it was. She stated that she had already stopped taking the iron supplements and that she would return to the lab at the stated day and time.  Olivia White, CMA
